# Patient Record
Sex: Female | Born: 1940 | Race: Black or African American | Hispanic: No | Marital: Married | Smoking: Former smoker
Health system: Southern US, Community
[De-identification: ages and names within clinical notes are randomized; demographics above are authoritative.]

## PROBLEM LIST (undated history)

## (undated) ENCOUNTER — Ambulatory Visit (HOSPITAL_BASED_OUTPATIENT_CLINIC_OR_DEPARTMENT_OTHER): Admission: EM

## (undated) DIAGNOSIS — E785 Hyperlipidemia, unspecified: Secondary | ICD-10-CM

## (undated) DIAGNOSIS — N189 Chronic kidney disease, unspecified: Secondary | ICD-10-CM

## (undated) DIAGNOSIS — E119 Type 2 diabetes mellitus without complications: Secondary | ICD-10-CM

## (undated) DIAGNOSIS — D649 Anemia, unspecified: Secondary | ICD-10-CM

## (undated) DIAGNOSIS — E079 Disorder of thyroid, unspecified: Secondary | ICD-10-CM

## (undated) DIAGNOSIS — I10 Essential (primary) hypertension: Secondary | ICD-10-CM

## (undated) HISTORY — DX: Chronic kidney disease, unspecified: N18.9

## (undated) HISTORY — PX: FOOT SURGERY: SHX648

## (undated) HISTORY — DX: Hyperlipidemia, unspecified: E78.5

## (undated) HISTORY — DX: Anemia, unspecified: D64.9

## (undated) HISTORY — PX: ABDOMINAL HYSTERECTOMY: SHX81

## (undated) HISTORY — DX: Type 2 diabetes mellitus without complications: E11.9

## (undated) HISTORY — DX: Essential (primary) hypertension: I10

## (undated) HISTORY — DX: Disorder of thyroid, unspecified: E07.9

---

## 2000-11-04 HISTORY — PX: TONSILLECTOMY: SUR1361

## 2002-12-17 ENCOUNTER — Other Ambulatory Visit: Admission: RE | Admit: 2002-12-17 | Discharge: 2002-12-17 | Payer: Self-pay | Admitting: Obstetrics and Gynecology

## 2003-03-17 ENCOUNTER — Ambulatory Visit (HOSPITAL_COMMUNITY): Admission: RE | Admit: 2003-03-17 | Discharge: 2003-03-17 | Payer: Self-pay | Admitting: *Deleted

## 2003-03-17 ENCOUNTER — Encounter: Payer: Self-pay | Admitting: *Deleted

## 2008-10-11 ENCOUNTER — Inpatient Hospital Stay (HOSPITAL_COMMUNITY): Admission: RE | Admit: 2008-10-11 | Discharge: 2008-10-12 | Payer: Self-pay | Admitting: Neurosurgery

## 2009-05-10 IMAGING — CR DG CHEST 2V
2 series · 2 of 2 positions shown · non-contrast
Comparison: None available

CLINICAL DATA: ACDF 10/11/2008.  Hypertension, smoker.

CHEST - 2 VIEW

[view not recorded (1 of 2)]
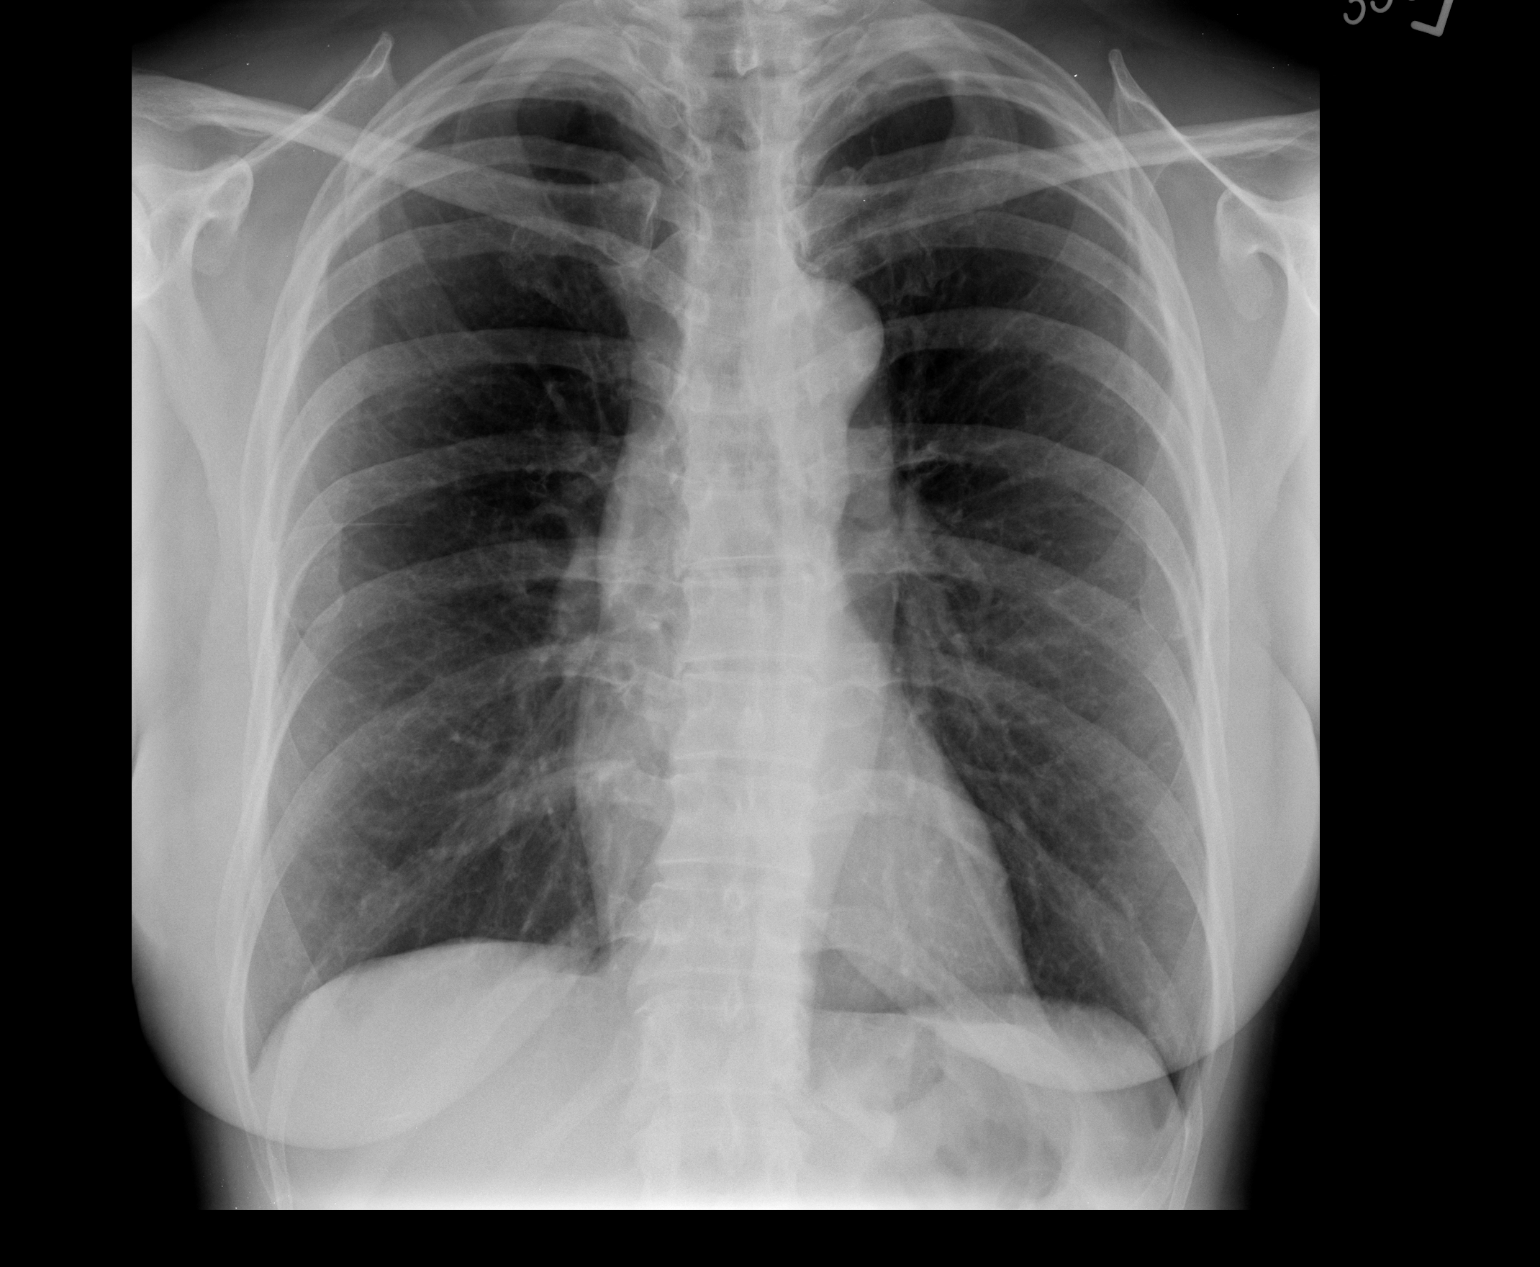

[view not recorded (2 of 2)]
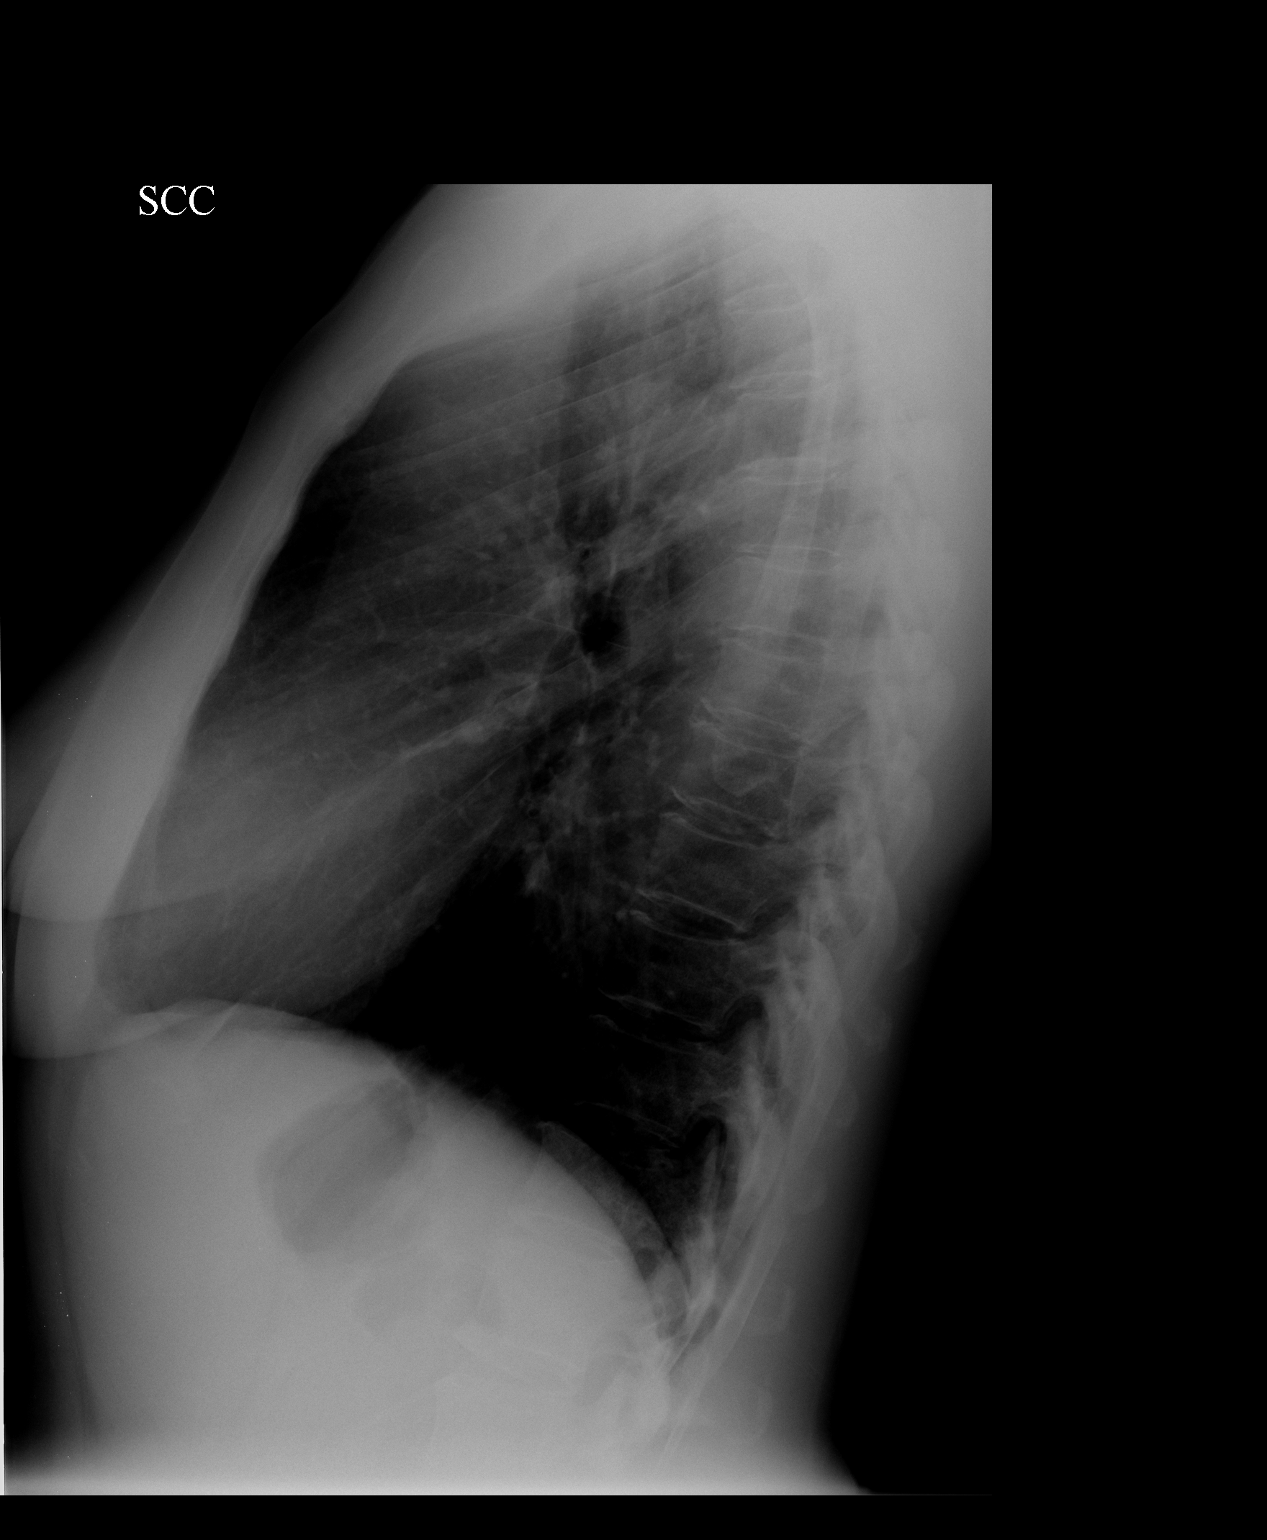

[2 of 2 positions shown; findings below may reference images not displayed]

FINDINGS: Trachea is midline.  Heart size normal.  Lungs are clear.
Biapical pleural thickening.  No pleural fluid.
IMPRESSION: No acute findings.

## 2009-05-16 IMAGING — CR DG CERVICAL SPINE 2 OR 3 VIEWS
1 series · 1 of 1 positions shown · non-contrast
Comparison: None

CLINICAL DATA: C4-5 ACDF

CERVICAL SPINE - 2-3 VIEW

[view not recorded]
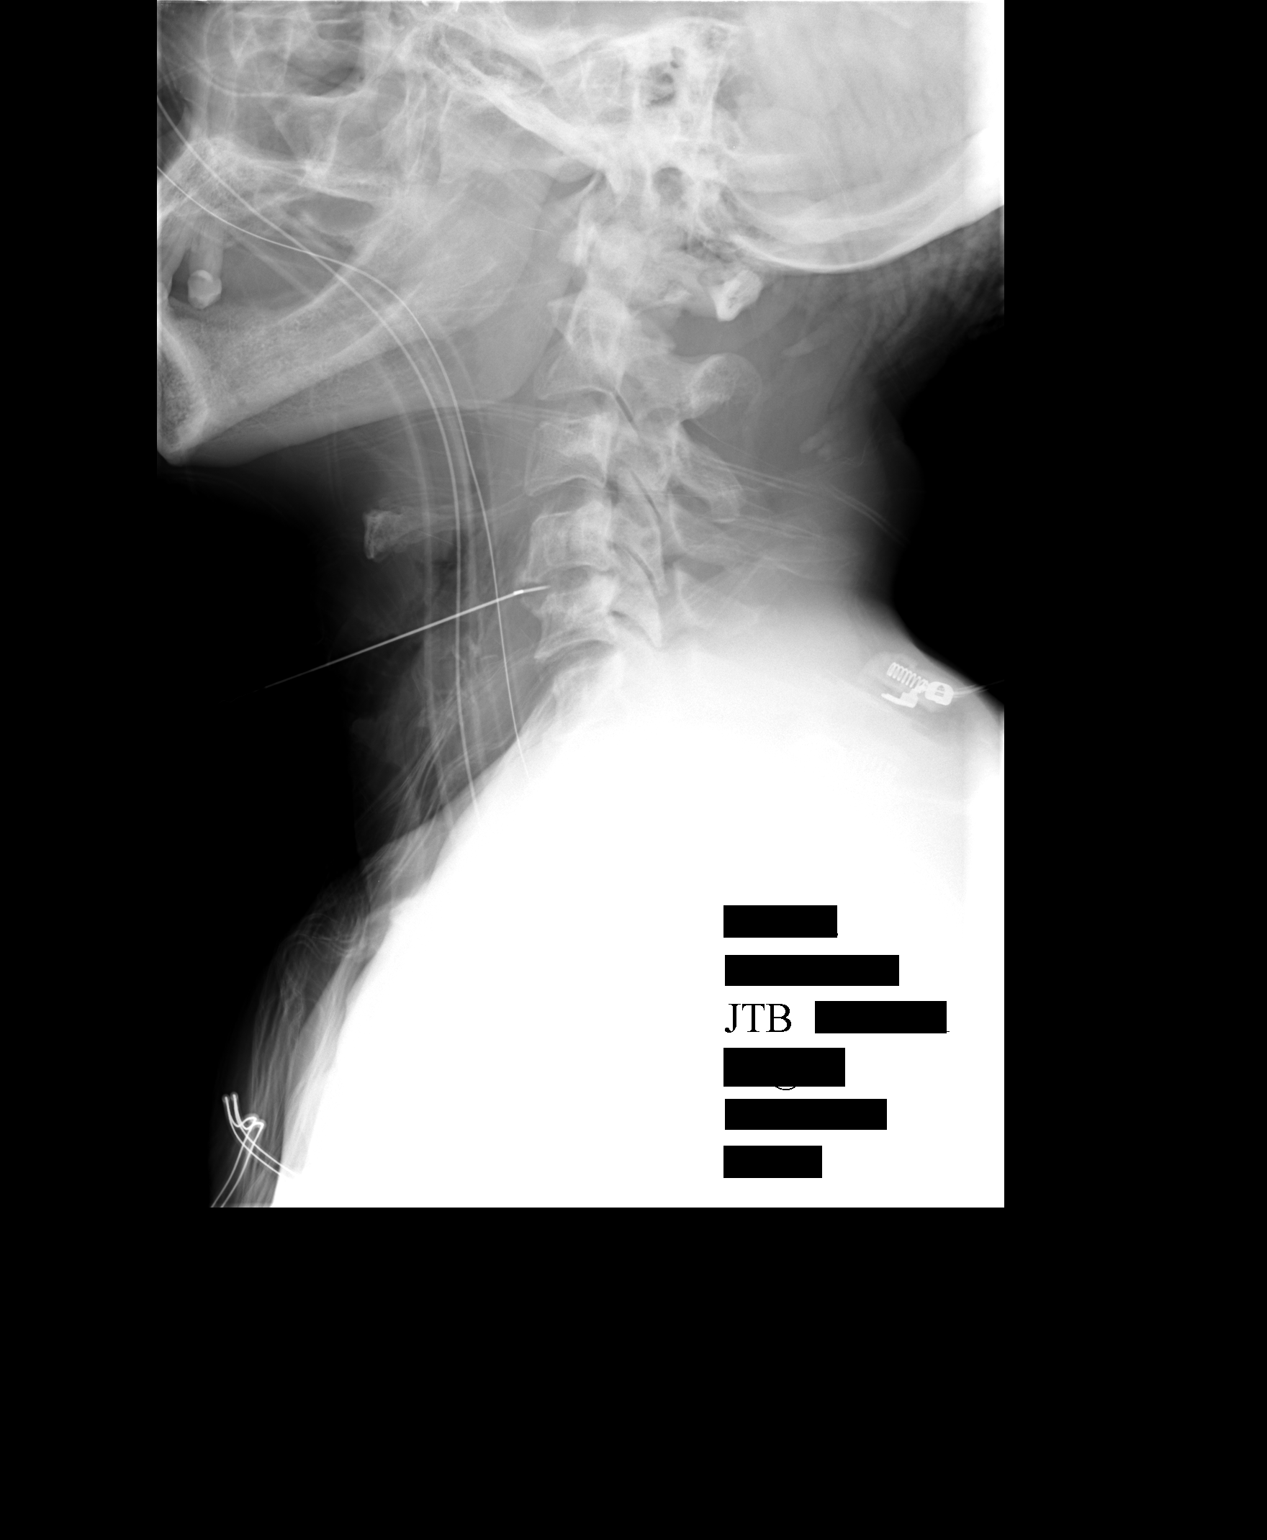

[1 of 1 positions shown; findings below may reference images not displayed]

FINDINGS: An initial film shows a needle anteriorly at C4-5.  A
second film shows anterior cervical discectomy and fusion at C4-5.
Interbody fusion material is in place.  There is an anterior plate
with screw fixation.  Sponges are in place along operative
approach.
IMPRESSION: ACDF C4-5

## 2011-03-19 NOTE — Op Note (Signed)
NAMEDONATA, [place] NO.:  [id number]   MEDICAL RECORD NO.:  [id number]          PATIENT TYPE:  INP   LOCATION:  2899                         FACILITY:  [facility]   PHYSICIAN:  Danae Orleans. Venetia Maxon, M.D.  DATE OF BIRTH:  1941-07-03   DATE OF PROCEDURE:  10/11/2008  DATE OF DISCHARGE:                               OPERATIVE REPORT   PREOPERATIVE DIAGNOSES:  Herniated cervical disk with spondylosis,  degenerative disk disease, and radiculopathy, C4-C5.   POSTOPERATIVE DIAGNOSES:  Herniated cervical disk with spondylosis,  degenerative disk disease, and radiculopathy, C4-C5.   PROCEDURE:  Anterior cervical decompression and fusion, C4-C5, with  allograft bone wedge, morcellized bone autograft, and anterior cervical  plate.   SURGEON:  Danae Orleans. Venetia Maxon, MD   ASSISTANTS:  1. Georgiann Cocker, RN  2. Hilda Lias, MD   ANESTHESIA:  General endotracheal anesthesia.   ESTIMATED BLOOD LOSS:  Minimal.   COMPLICATIONS:  None.   DISPOSITION:  Recovery.   INDICATIONS:  Katherine Richmond is a 70 year old woman with significant right  C5 radiculopathy with right deltoid weakness and pain.  She has  significant disk herniation at C4-C5 on the right.  It was elected to  take her to Surgery for anterior cervical decompression and fusion at  this affected level.   PROCEDURE IN DETAIL:  Katherine [place] was brought to the operating room.  Following a satisfactory and uncomplicated induction of general  endotracheal anesthesia and placement of intravenous lines, the patient  was placed in supine position on the operating table, and her neck was  placed in slight extension.  She was placed in 5 pounds halter traction.  Anterior neck was then prepped and draped in the usual sterile fashion.  The area of planned incision was infiltrated with local lidocaine.  Incision was made from the midline to the anterior border of  sternocleidomastoid muscle on the left side of midline overlying the C4-  C5 level and carried through platysma layer.  Blunt dissection was  performed along the anterior border of sternocleidomastoid muscle  keeping the carotid sheath lateral, trachea and esophagus kept medial,  exposing the cervical spine.  A bent spinal needle was placed at what  was felt to be the C4-C5 level, and this was confirmed on intraoperative  x-ray.  Subsequently, the longus colli muscles were taken down from the  anterior cervical spine using electrocautery and Key elevator, and a  self-retaining Shadow-Line retractor was placed to facilitate exposure.  A largely spondylitic interspace with highly degenerated disk material  was then incised and disk material was removed.  Endplates were stripped  of residual disk material.  Both lateral aspects of the interspace were  also cleared of disk material.  Ventral osteophytes were removed with  biting tools, and this bone was cleared of investing soft tissue and  later saved for use in bone grafting.  Subsequently under microscopic  visualization, the endplates were decorticated with high-speed drill,  and this bone was also saved for later bone grafting.  The uncinate  spurs were then drilled down.  Under microscopic visualization, the  posterior longitudinal ligament was incised initially with a nerve hook  and subsequently with 2-mm gold-tipped Kerrison rongeurs, and a large  amount of hypertrophied bone was removed.  The right-sided C5 nerve root  was widely decompressed, and herniated disk material was removed at this  level.  The left side was not nearly as compressed.  Hemostasis was  assured with Gelfoam soaked in thrombin.  After trial sizing, a 7-mm  allograft bone wedge was selected, fashioned with a high-speed drill,  packed with morcellized bone autograft, inserted in the interspace, and  countersunk appropriately.  A 14-mm Trestle anterior cervical plate was  then affixed to the anterior cervical spine using variable angled  14-mm  screws, two at C4 and two at C5.  All screws had excellent purchase.  The traction weight was removed prior to placing the plate.  The final x-  ray demonstrated well-positioned interbody grafts and anterior cervical  plate.  Wound was irrigated.  Soft tissues were inspected and found to  be in good repair.  Hemostasis was assured.  Platysma layer was closed  with 3-0 Vicryl sutures.  Skin edges were reapproximated with 3-0 Vicryl  subcuticular stitch.  The wound was dressed with Dermabond.  The patient  was extubated in the operating room and taken to recovery in stable and  satisfactory condition, having tolerated the operation well.  Counts  were correct at the end of the case.      Danae Orleans. Venetia Maxon, M.D.  Electronically Signed     JDS/MEDQ  D:  10/11/2008  T:  10/11/2008  Job:  [id number]

## 2011-08-09 LAB — CBC
HCT: 38.4 % (ref 36.0–46.0)
Hemoglobin: 13 g/dL (ref 12.0–15.0)
RBC: 4.49 MIL/uL (ref 3.87–5.11)
WBC: 6.2 10*3/uL (ref 4.0–10.5)

## 2011-08-09 LAB — BASIC METABOLIC PANEL
GFR calc Af Amer: >60 mL/min (ref >60)
GFR calc non Af Amer: >60 mL/min (ref >60)
Potassium: 4.4 mEq/L (ref 3.5–5.1)
Sodium: 139 mEq/L (ref 135–145)

## 2016-03-19 DIAGNOSIS — N289 Disorder of kidney and ureter, unspecified: Secondary | ICD-10-CM | POA: Diagnosis not present

## 2016-03-19 DIAGNOSIS — D509 Iron deficiency anemia, unspecified: Secondary | ICD-10-CM | POA: Diagnosis not present

## 2016-09-10 DIAGNOSIS — D631 Anemia in chronic kidney disease: Secondary | ICD-10-CM | POA: Diagnosis not present

## 2016-09-10 DIAGNOSIS — N189 Chronic kidney disease, unspecified: Secondary | ICD-10-CM

## 2016-09-10 DIAGNOSIS — D509 Iron deficiency anemia, unspecified: Secondary | ICD-10-CM | POA: Diagnosis not present

## 2016-11-04 HISTORY — PX: EYE SURGERY: SHX253

## 2016-12-05 HISTORY — PX: EYE SURGERY: SHX253

## 2017-03-18 DIAGNOSIS — Z862 Personal history of diseases of the blood and blood-forming organs and certain disorders involving the immune mechanism: Secondary | ICD-10-CM | POA: Diagnosis not present

## 2017-09-23 DIAGNOSIS — Z862 Personal history of diseases of the blood and blood-forming organs and certain disorders involving the immune mechanism: Secondary | ICD-10-CM | POA: Diagnosis not present

## 2018-03-26 DIAGNOSIS — C480 Malignant neoplasm of retroperitoneum: Secondary | ICD-10-CM | POA: Insufficient documentation

## 2018-05-13 HISTORY — PX: NEPHRECTOMY: SHX65

## 2018-07-05 DIAGNOSIS — C801 Malignant (primary) neoplasm, unspecified: Secondary | ICD-10-CM

## 2018-07-05 HISTORY — DX: Malignant (primary) neoplasm, unspecified: C80.1

## 2018-09-22 DIAGNOSIS — Z862 Personal history of diseases of the blood and blood-forming organs and certain disorders involving the immune mechanism: Secondary | ICD-10-CM

## 2018-09-22 DIAGNOSIS — N289 Disorder of kidney and ureter, unspecified: Secondary | ICD-10-CM

## 2018-09-22 DIAGNOSIS — Z905 Acquired absence of kidney: Secondary | ICD-10-CM

## 2019-03-23 DIAGNOSIS — Z862 Personal history of diseases of the blood and blood-forming organs and certain disorders involving the immune mechanism: Secondary | ICD-10-CM | POA: Diagnosis not present

## 2019-09-27 DIAGNOSIS — Z862 Personal history of diseases of the blood and blood-forming organs and certain disorders involving the immune mechanism: Secondary | ICD-10-CM

## 2019-11-05 DIAGNOSIS — C799 Secondary malignant neoplasm of unspecified site: Secondary | ICD-10-CM

## 2019-11-05 HISTORY — DX: Secondary malignant neoplasm of unspecified site: C79.9

## 2019-11-09 DIAGNOSIS — C787 Secondary malignant neoplasm of liver and intrahepatic bile duct: Secondary | ICD-10-CM | POA: Diagnosis not present

## 2019-11-09 DIAGNOSIS — C480 Malignant neoplasm of retroperitoneum: Secondary | ICD-10-CM | POA: Diagnosis not present

## 2019-11-09 DIAGNOSIS — K769 Liver disease, unspecified: Secondary | ICD-10-CM | POA: Diagnosis not present

## 2019-11-11 DIAGNOSIS — C480 Malignant neoplasm of retroperitoneum: Secondary | ICD-10-CM | POA: Diagnosis not present

## 2019-11-11 DIAGNOSIS — Z01812 Encounter for preprocedural laboratory examination: Secondary | ICD-10-CM | POA: Diagnosis not present

## 2019-11-11 DIAGNOSIS — Z20822 Contact with and (suspected) exposure to covid-19: Secondary | ICD-10-CM | POA: Diagnosis not present

## 2019-11-11 DIAGNOSIS — R7309 Other abnormal glucose: Secondary | ICD-10-CM | POA: Diagnosis not present

## 2019-11-11 DIAGNOSIS — Z01810 Encounter for preprocedural cardiovascular examination: Secondary | ICD-10-CM | POA: Diagnosis not present

## 2019-11-18 DIAGNOSIS — C480 Malignant neoplasm of retroperitoneum: Secondary | ICD-10-CM | POA: Diagnosis not present

## 2019-11-18 DIAGNOSIS — G8918 Other acute postprocedural pain: Secondary | ICD-10-CM | POA: Diagnosis not present

## 2019-11-18 DIAGNOSIS — C787 Secondary malignant neoplasm of liver and intrahepatic bile duct: Secondary | ICD-10-CM | POA: Diagnosis not present

## 2019-11-18 HISTORY — PX: LAPAROSCOPIC PARTIAL HEPATECTOMY: SHX5909

## 2019-11-29 DIAGNOSIS — H3581 Retinal edema: Secondary | ICD-10-CM | POA: Diagnosis not present

## 2019-11-29 DIAGNOSIS — E119 Type 2 diabetes mellitus without complications: Secondary | ICD-10-CM | POA: Diagnosis not present

## 2019-12-06 DIAGNOSIS — H524 Presbyopia: Secondary | ICD-10-CM | POA: Diagnosis not present

## 2019-12-14 DIAGNOSIS — Z48815 Encounter for surgical aftercare following surgery on the digestive system: Secondary | ICD-10-CM | POA: Diagnosis not present

## 2019-12-14 DIAGNOSIS — Z9089 Acquired absence of other organs: Secondary | ICD-10-CM | POA: Diagnosis not present

## 2019-12-14 DIAGNOSIS — C480 Malignant neoplasm of retroperitoneum: Secondary | ICD-10-CM | POA: Diagnosis not present

## 2019-12-21 ENCOUNTER — Ambulatory Visit (INDEPENDENT_AMBULATORY_CARE_PROVIDER_SITE_OTHER): Payer: Medicare PPO

## 2019-12-21 ENCOUNTER — Other Ambulatory Visit: Payer: Self-pay

## 2019-12-21 VITALS — BP 128/72 | HR 63 | Temp 97.8°F | Resp 16 | Ht 66.0 in | Wt 183.4 lb

## 2019-12-21 DIAGNOSIS — Z0000 Encounter for general adult medical examination without abnormal findings: Secondary | ICD-10-CM | POA: Diagnosis not present

## 2019-12-21 NOTE — Patient Instructions (Signed)
Fall Prevention in the Home, Adult Falls can cause injuries. They can happen to people of all ages. There are many things you can do to make your home safe and to help prevent falls. Ask for help when making these changes, if needed. What actions can I take to prevent falls? General Instructions  Use good lighting in all rooms. Replace any light bulbs that burn out.  Turn on the lights when you go into a dark area. Use night-lights.  Keep items that you use often in easy-to-reach places. Lower the shelves around your home if necessary.  Set up your furniture so you have a clear path. Avoid moving your furniture around.  Do not have throw rugs and other things on the floor that can make you trip.  Avoid walking on wet floors.  If any of your floors are uneven, fix them.  Add color or contrast paint or tape to clearly mark and help you see: ? Any grab bars or handrails. ? First and last steps of stairways. ? Where the edge of each step is.  If you use a stepladder: ? Make sure that it is fully opened. Do not climb a closed stepladder. ? Make sure that both sides of the stepladder are locked into place. ? Ask someone to hold the stepladder for you while you use it.  If there are any pets around you, be aware of where they are. What can I do in the bathroom?      Keep the floor dry. Clean up any water that spills onto the floor as soon as it happens.  Remove soap buildup in the tub or shower regularly.  Use non-skid mats or decals on the floor of the tub or shower.  Attach bath mats securely with double-sided, non-slip rug tape.  If you need to sit down in the shower, use a plastic, non-slip stool.  Install grab bars by the toilet and in the tub and shower. Do not use towel bars as grab bars. What can I do in the bedroom?  Make sure that you have a light by your bed that is easy to reach.  Do not use any sheets or blankets that are too big for your bed. They should  not hang down onto the floor.  Have a firm chair that has side arms. You can use this for support while you get dressed. What can I do in the kitchen?  Clean up any spills right away.  If you need to reach something above you, use a strong step stool that has a grab bar.  Keep electrical cords out of the way.  Do not use floor polish or wax that makes floors slippery. If you must use wax, use non-skid floor wax. What can I do with my stairs?  Do not leave any items on the stairs.  Make sure that you have a light switch at the top of the stairs and the bottom of the stairs. If you do not have them, ask someone to add them for you.  Make sure that there are handrails on both sides of the stairs, and use them. Fix handrails that are broken or loose. Make sure that handrails are as long as the stairways.  Install non-slip stair treads on all stairs in your home.  Avoid having throw rugs at the top or bottom of the stairs. If you do have throw rugs, attach them to the floor with carpet tape.  Choose a carpet that  does not hide the edge of the steps on the stairway.  Check any carpeting to make sure that it is firmly attached to the stairs. Fix any carpet that is loose or worn. What can I do on the outside of my home?  Use bright outdoor lighting.  Regularly fix the edges of walkways and driveways and fix any cracks.  Remove anything that might make you trip as you walk through a door, such as a raised step or threshold.  Trim any bushes or trees on the path to your home.  Regularly check to see if handrails are loose or broken. Make sure that both sides of any steps have handrails.  Install guardrails along the edges of any raised decks and porches.  Clear walking paths of anything that might make someone trip, such as tools or rocks.  Have any leaves, snow, or ice cleared regularly.  Use sand or salt on walking paths during winter.  Clean up any spills in your garage right  away. This includes grease or oil spills. What other actions can I take?  Wear shoes that: ? Have a low heel. Do not wear high heels. ? Have rubber bottoms. ? Are comfortable and fit you well. ? Are closed at the toe. Do not wear open-toe sandals.  Use tools that help you move around (mobility aids) if they are needed. These include: ? Canes. ? Walkers. ? Scooters. ? Crutches.  Review your medicines with your doctor. Some medicines can make you feel dizzy. This can increase your chance of falling. Ask your doctor what other things you can do to help prevent falls. Where to find more information  Centers for Disease Control and Prevention, [employer]AD[employer]: https://garcia.biz/  [employer] on Aging: BrainJudge.co.uk Contact a doctor if:  You are afraid of falling at home.  You feel weak, drowsy, or dizzy at home.  You fall at home. Summary  There are many simple things that you can do to make your home safe and to help prevent falls.  Ways to make your home safe include removing tripping hazards and installing grab bars in the bathroom.  Ask for help when making these changes in your home. This information is not intended to replace advice given to you by your health care provider. Make sure you discuss any questions you have with your health care provider. Document Revised: 02/11/2019 Document Reviewed: 06/05/2017 Elsevier Patient Education  2020 [place] Maintenance, Female Adopting a healthy lifestyle and getting preventive care are important in promoting health and wellness. Ask your health care provider about:  The right schedule for you to have regular tests and exams.  Things you can do on your own to prevent diseases and keep yourself healthy. What should I know about diet, weight, and exercise? Eat a healthy diet   Eat a diet that includes plenty of vegetables, fruits, low-fat dairy products, and lean protein.  Do not eat a lot of foods  that are high in solid fats, added sugars, or sodium. Maintain a healthy weight Body mass index (BMI) is used to identify weight problems. It estimates body fat based on height and weight. Your health care provider can help determine your BMI and help you achieve or maintain a healthy weight. Get regular exercise Get regular exercise. This is one of the most important things you can do for your health. Most adults should:  Exercise for at least 150 minutes each week. The exercise should increase your heart rate  and make you sweat (moderate-intensity exercise).  Do strengthening exercises at least twice a week. This is in addition to the moderate-intensity exercise.  Spend less time sitting. Even light physical activity can be beneficial. Watch cholesterol and blood lipids Have your blood tested for lipids and cholesterol at 77 years of age, then have this test every 5 years. Have your cholesterol levels checked more often if:  Your lipid or cholesterol levels are high.  You are older than 77 years of age.  You are at high risk for heart disease. What should I know about cancer screening? Depending on your health history and family history, you may need to have cancer screening at various ages. This may include screening for:  Breast cancer.  Cervical cancer.  Colorectal cancer.  Skin cancer.  Lung cancer. What should I know about heart disease, diabetes, and high blood pressure? Blood pressure and heart disease  High blood pressure causes heart disease and increases the risk of stroke. This is more likely to develop in people who have high blood pressure readings, are of African descent, or are overweight.  Have your blood pressure checked: ? Every 3-5 years if you are 11-76 years of age. ? Every year if you are 38 years old or older. Diabetes Have regular diabetes screenings. This checks your fasting blood sugar level. Have the screening done:  Once every three years after  age 89 if you are at a normal weight and have a low risk for diabetes.  More often and at a younger age if you are overweight or have a high risk for diabetes. What should I know about preventing infection? Hepatitis B If you have a higher risk for hepatitis B, you should be screened for this virus. Talk with your health care provider to find out if you are at risk for hepatitis B infection. Hepatitis C Testing is recommended for:  Everyone born from [birth year] through 1963.  Anyone with known risk factors for hepatitis C. Sexually transmitted infections (STIs)  Get screened for STIs, including gonorrhea and chlamydia, if: ? You are sexually active and are younger than 77 years of age. ? You are older than 77 years of age and your health care provider tells you that you are at risk for this type of infection. ? Your sexual activity has changed since you were last screened, and you are at increased risk for chlamydia or gonorrhea. Ask your health care provider if you are at risk.  Ask your health care provider about whether you are at high risk for HIV. Your health care provider may recommend a prescription medicine to help prevent HIV infection. If you choose to take medicine to prevent HIV, you should first get tested for HIV. You should then be tested every 3 months for as long as you are taking the medicine. Pregnancy  If you are about to stop having your period (premenopausal) and you may become pregnant, seek counseling before you get pregnant.  Take 400 to 800 micrograms (mcg) of folic acid every day if you become pregnant.  Ask for birth control (contraception) if you want to prevent pregnancy. Osteoporosis and menopause Osteoporosis is a disease in which the bones lose minerals and strength with aging. This can result in bone fractures. If you are 57 years old or older, or if you are at risk for osteoporosis and fractures, ask your health care provider if you should:  Be screened for  bone loss.  Take a calcium or vitamin  D supplement to lower your risk of fractures.  Be given hormone replacement therapy (HRT) to treat symptoms of menopause. Follow these instructions at home: Lifestyle  Do not use any products that contain nicotine or tobacco, such as cigarettes, e-cigarettes, and chewing tobacco. If you need help quitting, ask your health care provider.  Do not use street drugs.  Do not share needles.  Ask your health care provider for help if you need support or information about quitting drugs. Alcohol use  Do not drink alcohol if: ? Your health care provider tells you not to drink. ? You are pregnant, may be pregnant, or are planning to become pregnant.  If you drink alcohol: ? Limit how much you use to 0-1 drink a day. ? Limit intake if you are breastfeeding.  Be aware of how much alcohol is in your drink. In the [place] one drink equals one 12 oz bottle of beer (355 mL), one 5 oz glass of wine (148 mL), or one 1 oz glass of hard liquor (44 mL). General instructions  Schedule regular health, dental, and eye exams.  Stay current with your vaccines.  Tell your health care provider if: ? You often feel depressed. ? You have ever been abused or do not feel safe at home. Summary  Adopting a healthy lifestyle and getting preventive care are important in promoting health and wellness.  Follow your health care provider's instructions about healthy diet, exercising, and getting tested or screened for diseases.  Follow your health care provider's instructions on monitoring your cholesterol and blood pressure. This information is not intended to replace advice given to you by your health care provider. Make sure you discuss any questions you have with your health care provider. Document Revised: 10/14/2018 Document Reviewed: 10/14/2018 Elsevier Patient Education  [place] A mammogram is a low energy X-ray of the breasts that is done  to check for abnormal changes. This procedure can screen for and detect any changes that may indicate breast cancer. Mammograms are regularly done on women. A man may have a mammogram if he has a lump or swelling in his breast. A mammogram can also identify other changes and variations in the breast, such as:  Inflammation of the breast tissue (mastitis).  An infected area that contains a collection of pus (abscess).  A fluid-filled sac (cyst).  Fibrocystic changes. This is when breast tissue becomes denser, which can make the tissue feel rope-like or uneven under the skin.  Tumors that are not cancerous (benign). Tell a health care provider:  About any allergies you have.  If you have breast implants.  If you have had previous breast disease, biopsy, or surgery.  If you are breastfeeding.  If you are younger than age 66.  If you have a family history of breast cancer.  Whether you are pregnant or may be pregnant. What are the risks? Generally, this is a safe procedure. However, problems may occur, including:  Exposure to radiation. Radiation levels are very low with this test.  The results being misinterpreted.  The need for further tests.  The inability of the mammogram to detect certain cancers. What happens before the procedure?  Schedule your test about 1-2 weeks after your menstrual period if you are still menstruating. This is usually when your breasts are the least tender.  If you have had a mammogram done at a different facility in the past, get the mammogram X-rays or have them sent to your current  exam facility. The new and old images will be compared.  Wash your breasts and underarms on the day of the test.  Do not wear deodorants, perfumes, lotions, or powders anywhere on your body on the day of the test.  Remove any jewelry from your neck.  Wear clothes that you can change into and out of easily. What happens during the procedure?   You will undress  from the waist up and put on a gown that opens in the front.  You will stand in front of the X-ray machine.  Each breast will be placed between two plastic or glass plates. The plates will compress your breast for a few seconds. Try to stay as relaxed as possible during the procedure. This does not cause any harm to your breasts and any discomfort you feel will be very brief.  X-rays will be taken from different angles of each breast. The procedure may vary among health care providers and hospitals. What happens after the procedure?  The mammogram will be examined by a specialist (radiologist).  You may need to repeat certain parts of the test, depending on the quality of the images. This is commonly done if the radiologist needs a better view of the breast tissue.  You may resume your normal activities.  It is up to you to get the results of your procedure. Ask your health care provider, or the department that is doing the procedure, when your results will be ready. Summary  A mammogram is a low energy X-ray of the breasts that is done to check for abnormal changes. A man may have a mammogram if he has a lump or swelling in his breast.  If you have had a mammogram done at a different facility in the past, get the mammogram X-rays or have them sent to your current exam facility in order to compare them.  Schedule your test about 1-2 weeks after your menstrual period if you are still menstruating.  For this test, each breast will be placed between two plastic or glass plates. The plates will compress your breast for a few seconds.  Ask when your test results will be ready. Make sure you get your test results. This information is not intended to replace advice given to you by your health care provider. Make sure you discuss any questions you have with your health care provider. Document Revised: 06/11/2018 Document Reviewed: 06/11/2018 Elsevier Patient Education  2020 [place] you for enrolling in [place]. Please follow the instructions below to securely access your online medical record. MyChart allows you to send messages to your doctor, view your test results, manage appointments, and more.   How Do I Sign Up? 1. In your Internet browser, go to [employer] and enter [email or web address]. 2. Click on the Sign Up Now link in the Sign In box. You will see the New Member Sign Up page. 3. Enter your MyChart Access Code exactly as it appears below. You will not need to use this code after youve completed the sign-up process. If you do not sign up before the expiration date, you must request a new code.  MyChart Access Code: [email or web address] Expires: 01/13/2020  3:10 AM  4. Enter your Social Security Number ([id number]) and Date of Birth (mm/dd/yyyy) as indicated and click Submit. You will be taken to the next sign-up page. 5. Create a MyChart ID. This will be your MyChart login ID and cannot be changed, so think of one that is  secure and easy to remember. 6. Create a MyChart password. You can change your password at any time. 7. Enter your Password Reset Question and Answer. This can be used at a later time if you forget your password.  8. Enter your e-mail address. You will receive e-mail notification when new information is available in [place]. 9. Click Sign Up. You can now view your medical record.   Additional Information Remember, MyChart is NOT to be used for urgent needs. For medical emergencies, dial 911.

## 2019-12-21 NOTE — Progress Notes (Signed)
Subjective:   Katherine Richmond is a 79 y.o. female who presents for Medicare Annual (Subsequent) preventive examination. This wellness visit is conducted by a nurse.  The patient's medications were reviewed and reconciled since the patient's last visit.  History details were provided by the patient.  The history appears to be reliable.    Patient's last AWV was 1 year ago.   Medical History: Patient history and Family history was reviewed  Medications, Allergies, and preventative health maintenance was reviewed and updated.  Review of Systems:  Review of Systems  Constitutional: Negative.   HENT: Negative.   Respiratory: Negative.  Negative for cough and wheezing.   Cardiovascular: Negative.  Negative for chest pain and palpitations.  Neurological: Negative.   Psychiatric/Behavioral: Negative.  Negative for confusion and suicidal ideas.   Cardiac Risk Factors include: advanced age (>62men, >63 women);diabetes mellitus     Objective:     Vitals: BP 128/72 (BP Location: Left Arm, Patient Position: Sitting, Cuff Size: Large)   Pulse 63   Temp 97.8 F (36.6 C) (Temporal)   Resp 16   Ht 5\' 6"  (1.676 m)   Wt 183 lb 6.4 oz (83.2 kg)   SpO2 97%   BMI 29.60 kg/m   Body mass index is 29.6 kg/m.  No flowsheet data found.  Tobacco Social History   Tobacco Use  Smoking Status Former Smoker  . Types: Cigarettes  . Quit date: 2012  . Years since quitting: 9.1  Smokeless Tobacco Never Used     Counseling given: Not Answered   Clinical Intake:     Pain Score: 0-No pain                 Past Medical History:  Diagnosis Date  . Anemia   . Chronic kidney disease   . Diabetes mellitus without complication ([place])   . Hyperlipidemia   . Hypertension   . Thyroid disease    Past Surgical History:  Procedure Laterality Date  . ABDOMINAL HYSTERECTOMY    . EYE SURGERY Right 11/2016   CATARACT REMOVAL  . EYE SURGERY Left 12/2016   CATARACT REMOVAL  .  NEPHRECTOMY Right 05/13/2018  . TONSILLECTOMY  2002   Family History  Problem Relation Age of Onset  . Breast cancer Mother   . Colon cancer Mother   . Renal cancer Mother   . Liver cancer Half-Brother   . Multiple sclerosis Half-Brother   . Hypertension Other   . Transient ischemic attack Other   . Glaucoma Other    Social History   Socioeconomic History  . Marital status: Married    Spouse name: Engineer, drilling  . Number of children: 1  . Years of education: Not on file  . Highest education level: Not on file  Occupational History  . Occupation: RETIRED  . Occupation: [employer], [place], [employer]  Tobacco Use  . Smoking status: Former Smoker    Types: Cigarettes    Quit date: 2012    Years since quitting: 9.1  . Smokeless tobacco: Never Used  Substance and Sexual Activity  . Alcohol use: Yes    Alcohol/week: 1.0 standard drinks    Types: 1 Glasses of wine per week    Comment: SOCIAL USE ONLY  . Drug use: Never  . Sexual activity: Not on file  Other Topics Concern  . Not on file  Social History Narrative  . Not on file   Social Determinants of Health   Financial Resource  Strain:   . Difficulty of Paying Living Expenses: Not on file  Food Insecurity:   . Worried About Charity fundraiser in the Last Year: Not on file  . Ran Out of Food in the Last Year: Not on file  Transportation Needs:   . Lack of Transportation (Medical): Not on file  . Lack of Transportation (Non-Medical): Not on file  Physical Activity:   . Days of Exercise per Week: Not on file  . Minutes of Exercise per Session: Not on file  Stress:   . Feeling of Stress : Not on file  Social Connections:   . Frequency of Communication with Friends and Family: Not on file  . Frequency of Social Gatherings with Friends and Family: Not on file  . Attends Religious Services: Not on file  . Active Member of Clubs or Organizations: Not on file  . Attends Archivist Meetings: Not on file  . Marital  Status: Not on file    Outpatient Encounter Medications as of 12/21/2019  Medication Sig  . aspirin 81 MG EC tablet Take by mouth.  Marland Kitchen atorvastatin (LIPITOR) 40 MG tablet TAKE 1 TABLET BY MOUTH DAILY  . estradiol (ESTRACE) 1 MG tablet TAKE 1 TABLET BY MOUTH ONCE DAILY. NEEDS TO CALL FOR APPT SOON  . ferrous sulfate 325 (65 FE) MG tablet Take by mouth.  . fluticasone (FLONASE) 50 MCG/ACT nasal spray INSERT 2 SPRAY(S) IN EACH NOSTRIL DAILY AS NEEDED FOR ALLERGIES  . gabapentin (NEURONTIN) 300 MG capsule Take by mouth.  . irbesartan-hydrochlorothiazide (AVALIDE) 150-12.5 MG tablet Take by mouth.  . levothyroxine (SYNTHROID) 50 MCG tablet Take by mouth.  . metFORMIN (GLUCOPHAGE) 1000 MG tablet TAKE 1 TABLET BY MOUTH DAILY  . metoprolol succinate (TOPROL-XL) 100 MG 24 hr tablet Take by mouth.  . Multiple Vitamins-Minerals (MULTIPLE VITAMINS/WOMENS PO) Take by mouth.  . Omega-3 Fatty Acids (OMEGA-3 FISH OIL) 1200 MG CAPS Take by mouth.  . traZODone (DESYREL) 50 MG tablet Take by mouth.   No facility-administered encounter medications on file as of 12/21/2019.    Activities of Daily Living In your present state of health, do you have any difficulty performing the following activities: 12/21/2019  Hearing? N  Vision? N  Difficulty concentrating or making decisions? N  Walking or climbing stairs? N  Dressing or bathing? N  Doing errands, shopping? N  Preparing Food and eating ? N  Using the Toilet? N  In the past six months, have you accidently leaked urine? N  Do you have problems with loss of bowel control? N  Managing your Medications? N  Managing your Finances? N  Housekeeping or managing your Housekeeping? N  Some recent data might be hidden    Patient Care Team: Rochel Brome, MD as PCP - General (Family Medicine)    Assessment:   This is a routine wellness examination for Katherine Richmond.  Exercise Activities and Dietary recommendations Current Exercise Habits: Home exercise routine,  Type of exercise: walking, Intensity: Mild, Exercise limited by: None identified  Goals   None     Fall Risk Fall Risk  12/21/2019  Falls in the past year? 0  Number falls in past yr: 0  Injury with Fall? 0   Is the patient's home free of loose throw rugs in walkways, pet beds, electrical cords, etc?   yes      Grab bars in the bathroom? no      Handrails on the stairs?   yes  Adequate lighting?   yes   Depression Screen PHQ 2/9 Scores 12/21/2019  PHQ - 2 Score 0     Cognitive Function     6CIT Screen 12/21/2019  What Year? 0 points  What month? 0 points  What time? 0 points  Count back from 20 0 points  Months in reverse 0 points  Repeat phrase 0 points  Total Score 0    Immunization History  Administered Date(s) Administered  . Influenza-Unspecified 06/16/2019  . Pneumococcal Conjugate-13 08/03/2015  . Pneumococcal Polysaccharide-23 11/08/2013  . Tdap 01/05/2013  . Zoster 09/10/2016     Screening Tests Health Maintenance  Topic Date Due  . MAMMOGRAM  2019  . DEXA SCAN  12/21/2020  . TETANUS/TDAP  01/06/2023  . INFLUENZA VACCINE  06/16/2019  . PNA vac Low Risk Adult  Completed    Cancer Screenings: Lung: Low Dose CT Chest recommended if Age 74-80 years, 30 pack-year currently smoking OR have quit w/in 15years. Patient does not qualify. Breast:  Up to date on Mammogram? No   Up to date of Bone Density/Dexa? Yes Colorectal: Colonoscopy done 2021 (Dr Lyda Jester)     Plan:     Patient will follow-up with Dr Tobie Poet and her Oncologist as scheduled.  She will set up a mammogram when she see's her gynecologist.  Patient will continue a healthy diet and to exercise regularly.  I have personally reviewed and noted the following in the patient's chart:   . Medical and social history . Use of alcohol, tobacco or illicit drugs  . Current medications and supplements . Functional ability and status . Nutritional status . Physical activity . Advanced  directives . List of other physicians . Hospitalizations, surgeries, and ER visits in previous 12 months . Vitals . Screenings to include cognitive, depression, and falls . Referrals and appointments  In addition, I have reviewed and discussed with patient certain preventive protocols, quality metrics, and best practice recommendations. A written personalized care plan for preventive services as well as general preventive health recommendations were provided to patient.     Erie Noe, LPN  [id number]

## 2019-12-23 ENCOUNTER — Other Ambulatory Visit: Payer: Self-pay | Admitting: Family Medicine

## 2019-12-28 ENCOUNTER — Telehealth: Payer: Self-pay

## 2019-12-28 NOTE — Telephone Encounter (Signed)
PHARMACY:[facility]  MEDICATION: 1.) gabapentin 300 mg 1 at bt 2.) metoprolol succinate 100 mg  er 1 daily  requesting a 90 day supply  MSG: THE PT CALLED TODAY REQUESTING A REFILL ON THE MEDICATION(S) LISTED ABOVE.

## 2019-12-29 ENCOUNTER — Other Ambulatory Visit: Payer: Self-pay | Admitting: Family Medicine

## 2019-12-29 MED ORDER — GABAPENTIN 300 MG PO CAPS: 300.0000 mg | ORAL_CAPSULE | Freq: Every day | ORAL | 0 refills | Status: DC

## 2020-01-08 MED ORDER — METOPROLOL SUCCINATE ER 100 MG PO TB24: 100.0000 mg | ORAL_TABLET | Freq: Every day | ORAL | 1 refills | Status: DC

## 2020-01-12 ENCOUNTER — Other Ambulatory Visit: Payer: Self-pay

## 2020-01-12 MED ORDER — METOPROLOL SUCCINATE ER 100 MG PO TB24: 100.0000 mg | ORAL_TABLET | Freq: Every day | ORAL | 1 refills | Status: DC

## 2020-01-15 ENCOUNTER — Other Ambulatory Visit: Payer: Self-pay | Admitting: Family Medicine

## 2020-03-15 DIAGNOSIS — E079 Disorder of thyroid, unspecified: Secondary | ICD-10-CM | POA: Insufficient documentation

## 2020-03-15 DIAGNOSIS — E785 Hyperlipidemia, unspecified: Secondary | ICD-10-CM | POA: Insufficient documentation

## 2020-03-15 DIAGNOSIS — E1159 Type 2 diabetes mellitus with other circulatory complications: Secondary | ICD-10-CM | POA: Insufficient documentation

## 2020-03-15 DIAGNOSIS — E119 Type 2 diabetes mellitus without complications: Secondary | ICD-10-CM | POA: Insufficient documentation

## 2020-03-15 NOTE — Progress Notes (Signed)
Subjective:  Patient ID: Katherine Richmond, female    DOB: [birth year]  Age: 79 y.o. MRN: [id number]  Chief Complaint  Patient presents with  . Hypertension  . Hyperlipidemia  . Diabetes  . 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 Patient's diabetes has been well controlled.  She does not check her sugars.  She denies excessive thirst, excessive urination, or excessive hunger.  She does check her feet daily and has no sores.  Her most recent diabetic exam was December/2020. Patient has hypertensive chronic kidney disease. Stage 3A. Denies chest pain or headaches.  Her blood pressure has been well controlled 120s over 80s.  She is currently on irbesartan/hydrochlorothiazide 150-12.5 mg once daily, metoprolol succinate 100 mg 1 p.o. daily.  For her hyperlipidemia she is currently on OTC fish oil 1200 mg once daily and Lipitor 40 mg once daily.  She also takes aspirin 81 mg once daily.  Patient was diagnosed with leiomyosarcoma of the retroperitoneum in 2019 and found to have liver metastases in 11/2019. All has been resected and is currently being monitored by oncology (Dr. Lavera Guise) and Dr. Crisoforo Oxford (general surgery.)  Past Medical History:  Diagnosis Date  . Anemia   . Chronic kidney disease   . Diabetes mellitus without complication ([place])   . Hyperlipidemia   . Hypertension   . Thyroid disease    Past Surgical History:  Procedure Laterality Date  . ABDOMINAL HYSTERECTOMY    . EYE SURGERY Right 11/2016   CATARACT REMOVAL  . EYE SURGERY Left 12/2016   CATARACT REMOVAL  . NEPHRECTOMY Right 05/13/2018  . TONSILLECTOMY  2002    Family History  Problem Relation Age of Onset  . Breast cancer Mother   . Colon cancer Mother   . Renal cancer Mother   . Liver cancer  Half-Brother   . Multiple sclerosis Half-Brother   . Hypertension Other   . Transient ischemic attack Other   . Glaucoma Other    Social History   Socioeconomic History  . Marital status: Married    Spouse name: Engineer, drilling  . Number of children: 1  . Years of education: Not on file  . Highest education level: Not on file  Occupational History  . Occupation: RETIRED  . Occupation: [employer], [place], [employer]  Tobacco Use  . Smoking status: Former Smoker    Types: Cigarettes    Quit date: 2012    Years since quitting: 9.3  . Smokeless tobacco: Never Used  Substance and Sexual Activity  . Alcohol use: Yes    Alcohol/week: 1.0 standard drinks    Types: 1 Glasses of wine per week    Comment: SOCIAL USE ONLY  . Drug use: Never  . Sexual activity: Not on file  Other Topics Concern  . Not on file  Social History Narrative  . Not on file   Social Determinants of Health   Financial Resource Strain:   . Difficulty of Paying Living Expenses:   Food Insecurity:   . Worried About Charity fundraiser in the Last Year:   . Arboriculturist in the Last Year:   Transportation Needs:   . Film/video editor (Medical):   Marland Kitchen Lack of Transportation (Non-Medical):   Physical Activity:   . Days of Exercise per Week:   . Minutes of  Exercise per Session:   Stress:   . Feeling of Stress :   Social Connections:   . Frequency of Communication with Friends and Family:   . Frequency of Social Gatherings with Friends and Family:   . Attends Religious Services:   . Active Member of Clubs or Organizations:   . Attends Archivist Meetings:   Marland Kitchen Marital Status:     Review of Systems  Constitutional: Negative for chills, diaphoresis, fatigue and fever.  HENT: Negative for congestion, ear pain, rhinorrhea and sore throat.   Respiratory: Negative for cough and shortness of breath.   Cardiovascular: Negative for chest pain.  Gastrointestinal: Negative for abdominal pain, constipation,  diarrhea, nausea and vomiting.  Endocrine: Negative for polydipsia, polyphagia and polyuria.  Genitourinary: Negative for dysuria and urgency.  Musculoskeletal: Negative for arthralgias, back pain and myalgias.  Neurological: Negative for dizziness, weakness, light-headedness and headaches.  Psychiatric/Behavioral: Negative for dysphoric mood. The patient is not nervous/anxious.      Objective:  BP 124/64   Pulse 60   Temp (!) 97.4 F (36.3 C)   Resp 16   Ht 5\' 6"  (1.676 m)   Wt 178 lb 9.6 oz (81 kg)   BMI 28.83 kg/m   BP/Weight 03/20/2020 [id number]  Systolic BP [id number]  Diastolic BP 64 72  Wt. (Lbs) 178.6 183.4  BMI 28.83 29.6    Physical Exam Vitals reviewed.  Constitutional:      General: She is not in acute distress.    Appearance: Normal appearance. She is obese.  Neck:     Thyroid: No thyroid mass.     Vascular: No carotid bruit.  Cardiovascular:     Rate and Rhythm: Normal rate and regular rhythm.     Pulses: Normal pulses.     Heart sounds: No murmur.  Pulmonary:     Effort: Pulmonary effort is normal. No respiratory distress.     Breath sounds: Normal breath sounds.  Abdominal:     General: Bowel sounds are normal.     Palpations: Abdomen is soft. There is no mass.     Tenderness: There is no abdominal tenderness.  Skin:    General: Skin is warm and dry.  Neurological:     Mental Status: She is alert and oriented to person, place, and time.     Cranial Nerves: No cranial nerve deficit.  Psychiatric:        Mood and Affect: Mood normal.        Behavior: Behavior normal.    Diabetic Foot Exam - Simple   Simple Foot Form Diabetic Foot exam was performed with the following findings: Yes 03/20/2020  9:41 AM  Visual Inspection No deformities, no ulcerations, no other skin breakdown bilaterally: Yes Sensation Testing Intact to touch and monofilament testing bilaterally: Yes Pulse Check Posterior Tibialis and Dorsalis pulse intact bilaterally:  Yes Comments        Lab Results  Component Value Date   WBC 6.2 10/05/2008   HGB 13.0 10/05/2008   HCT 38.4 10/05/2008   PLT 270 10/05/2008   GLUCOSE 106 (H) 10/05/2008   NA 139 10/05/2008   K 4.4 10/05/2008   CL 105 10/05/2008   CREATININE 0.80 10/05/2008   BUN 11 10/05/2008   CO2 28 10/05/2008   MICROALBUR 80 03/20/2020      Assessment & Plan:  1. Essential hypertension with CKD N18.3a. Well controlled.  No changes to medicines.  Continue to work on eating a healthy diet and  exercise.  Labs drawn today.  - Comprehensive metabolic panel  2. Thyroid disease (hypothyroidism) - TSH  3. Mixed hyperlipidemia Well controlled.  No changes to medicines.  Continue to work on eating a healthy diet and exercise.  Labs drawn today.  - Lipid panel  4. Combined hyperlipidemia associated with type 2 diabetes mellitus (HCC) Control: good Recommend check feet daily. Recommend annual eye exams. Medicines: no changes.  Continue to work on eating a healthy diet and exercise.  Labs drawn today.   - Hemoglobin A1c - CBC with Differential/Platelet - POCT UA - Microalbumin  5. 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 Liver metastases [facility]) Following with oncology  7. Overweight with body mass index (BMI) of 28 to 28.9 in adult  Recommend continue to work on eating healthy diet and exercise.  Follow-up: Return in about 3 months (around 06/20/2020) for fasting.  An After Visit Summary was printed and given to the patient.  Rochel Brome [facility] [phone]

## 2020-03-20 ENCOUNTER — Encounter: Payer: Self-pay | Admitting: Family Medicine

## 2020-03-20 ENCOUNTER — Other Ambulatory Visit: Payer: Self-pay

## 2020-03-20 ENCOUNTER — Ambulatory Visit: Payer: Medicare PPO | Admitting: Family Medicine

## 2020-03-20 VITALS — BP 124/64 | HR 60 | Temp 97.4°F | Resp 16 | Ht 66.0 in | Wt 178.6 lb

## 2020-03-20 DIAGNOSIS — C480 Malignant neoplasm of retroperitoneum: Secondary | ICD-10-CM

## 2020-03-20 DIAGNOSIS — Z6828 Body mass index (BMI) 28.0-28.9, adult: Secondary | ICD-10-CM

## 2020-03-20 DIAGNOSIS — E038 Other specified hypothyroidism: Secondary | ICD-10-CM

## 2020-03-20 DIAGNOSIS — Z6829 Body mass index (BMI) 29.0-29.9, adult: Secondary | ICD-10-CM | POA: Insufficient documentation

## 2020-03-20 DIAGNOSIS — I10 Essential (primary) hypertension: Secondary | ICD-10-CM

## 2020-03-20 DIAGNOSIS — C787 Secondary malignant neoplasm of liver and intrahepatic bile duct: Secondary | ICD-10-CM

## 2020-03-20 DIAGNOSIS — E1169 Type 2 diabetes mellitus with other specified complication: Secondary | ICD-10-CM | POA: Diagnosis not present

## 2020-03-20 DIAGNOSIS — E782 Mixed hyperlipidemia: Secondary | ICD-10-CM | POA: Diagnosis not present

## 2020-03-20 DIAGNOSIS — E663 Overweight: Secondary | ICD-10-CM

## 2020-03-20 DIAGNOSIS — N1831 Chronic kidney disease, stage 3a: Secondary | ICD-10-CM | POA: Insufficient documentation

## 2020-03-20 LAB — POCT UA - MICROALBUMIN: Microalbumin Ur, POC: 80 mg/L

## 2020-03-20 NOTE — Patient Instructions (Signed)
Have a great summer!

## 2020-03-21 LAB — HEMOGLOBIN A1C
Est. average glucose Bld gHb Est-mCnc: 137 mg/dL
Hgb A1c MFr Bld: 6.4 % — ABNORMAL HIGH (ref 4.8–5.6)

## 2020-03-21 LAB — CBC WITH DIFFERENTIAL/PLATELET
Basophils Absolute: 0 10*3/uL (ref 0.0–0.2)
Basos: 1 %
EOS (ABSOLUTE): 0.1 10*3/uL (ref 0.0–0.4)
Eos: 4 %
Hematocrit: 33.6 % — ABNORMAL LOW (ref 34.0–46.6)
Hemoglobin: 11.2 g/dL (ref 11.1–15.9)
Immature Grans (Abs): 0 10*3/uL (ref 0.0–0.1)
Immature Granulocytes: 0 %
Lymphocytes Absolute: 1.4 10*3/uL (ref 0.7–3.1)
Lymphs: 35 %
MCH: 28.7 pg (ref 26.6–33.0)
MCHC: 33.3 g/dL (ref 31.5–35.7)
MCV: 86 fL (ref 79–97)
Monocytes Absolute: 0.4 10*3/uL (ref 0.1–0.9)
Monocytes: 9 %
Neutrophils Absolute: 2 10*3/uL (ref 1.4–7.0)
Neutrophils: 51 %
Platelets: 156 10*3/uL (ref 150–450)
RBC: 3.9 x10E6/uL (ref 3.77–5.28)
RDW: 12.7 % (ref 11.7–15.4)
WBC: 3.9 10*3/uL (ref 3.4–10.8)

## 2020-03-21 LAB — COMPREHENSIVE METABOLIC PANEL
ALT: 17 IU/L (ref 0–32)
AST: 33 IU/L (ref 0–40)
Albumin/Globulin Ratio: 1.6 (ref 1.2–2.2)
Albumin: 4.4 g/dL (ref 3.7–4.7)
Alkaline Phosphatase: 113 IU/L (ref 48–121)
BUN/Creatinine Ratio: 9 — ABNORMAL LOW (ref 12–28)
BUN: 12 mg/dL (ref 8–27)
Bilirubin Total: 0.2 mg/dL (ref 0.0–1.2)
CO2: 20 mmol/L (ref 20–29)
Calcium: 9.8 mg/dL (ref 8.7–10.3)
Chloride: 102 mmol/L (ref 96–106)
Creatinine, Ser: 1.4 mg/dL — ABNORMAL HIGH (ref 0.57–1.00)
GFR calc Af Amer: 42 mL/min/{1.73_m2} — ABNORMAL LOW (ref >59)
GFR calc non Af Amer: 37 mL/min/{1.73_m2} — ABNORMAL LOW (ref >59)
Globulin, Total: 2.7 g/dL (ref 1.5–4.5)
Glucose: 107 mg/dL — ABNORMAL HIGH (ref 65–99)
Potassium: 5 mmol/L (ref 3.5–5.2)
Sodium: 140 mmol/L (ref 134–144)
Total Protein: 7.1 g/dL (ref 6.0–8.5)

## 2020-03-21 LAB — LIPID PANEL
Chol/HDL Ratio: 3.3 ratio (ref 0.0–4.4)
Cholesterol, Total: 193 mg/dL (ref 100–199)
HDL: 59 mg/dL (ref >39)
LDL Chol Calc (NIH): 103 mg/dL — ABNORMAL HIGH (ref 0–99)
Triglycerides: 182 mg/dL — ABNORMAL HIGH (ref 0–149)
VLDL Cholesterol Cal: 31 mg/dL (ref 5–40)

## 2020-03-21 LAB — CARDIOVASCULAR RISK ASSESSMENT

## 2020-03-21 LAB — TSH: TSH: 2.05 u[IU]/mL (ref 0.450–4.500)

## 2020-03-27 ENCOUNTER — Other Ambulatory Visit: Payer: Self-pay

## 2020-03-27 DIAGNOSIS — N189 Chronic kidney disease, unspecified: Secondary | ICD-10-CM

## 2020-03-27 DIAGNOSIS — D631 Anemia in chronic kidney disease: Secondary | ICD-10-CM

## 2020-03-28 ENCOUNTER — Other Ambulatory Visit: Payer: Self-pay | Admitting: Family Medicine

## 2020-04-21 ENCOUNTER — Other Ambulatory Visit: Payer: Self-pay

## 2020-05-16 ENCOUNTER — Other Ambulatory Visit: Payer: Self-pay | Admitting: Family Medicine

## 2020-06-05 ENCOUNTER — Other Ambulatory Visit: Payer: Self-pay | Admitting: Family Medicine

## 2020-06-08 ENCOUNTER — Other Ambulatory Visit: Payer: Self-pay

## 2020-06-08 MED ORDER — FLUTICASONE PROPIONATE 50 MCG/ACT NA SUSP: 2.0000 | Freq: Every day | NASAL | 1 refills | Status: DC

## 2020-06-19 ENCOUNTER — Other Ambulatory Visit: Payer: Self-pay | Admitting: Family Medicine

## 2020-06-26 ENCOUNTER — Other Ambulatory Visit: Payer: Self-pay

## 2020-06-26 ENCOUNTER — Ambulatory Visit: Payer: Medicare PPO | Admitting: Family Medicine

## 2020-06-26 ENCOUNTER — Encounter: Payer: Self-pay | Admitting: Family Medicine

## 2020-06-26 VITALS — BP 124/68 | HR 60 | Temp 97.2°F | Resp 16 | Ht 66.0 in | Wt 182.0 lb

## 2020-06-26 DIAGNOSIS — M791 Myalgia, unspecified site: Secondary | ICD-10-CM

## 2020-06-26 DIAGNOSIS — E1121 Type 2 diabetes mellitus with diabetic nephropathy: Secondary | ICD-10-CM | POA: Diagnosis not present

## 2020-06-26 DIAGNOSIS — C480 Malignant neoplasm of retroperitoneum: Secondary | ICD-10-CM

## 2020-06-26 DIAGNOSIS — E782 Mixed hyperlipidemia: Secondary | ICD-10-CM

## 2020-06-26 DIAGNOSIS — I129 Hypertensive chronic kidney disease with stage 1 through stage 4 chronic kidney disease, or unspecified chronic kidney disease: Secondary | ICD-10-CM

## 2020-06-26 DIAGNOSIS — N1831 Chronic kidney disease, stage 3a: Secondary | ICD-10-CM

## 2020-06-26 LAB — POCT UA - MICROALBUMIN: Microalbumin Ur, POC: 10 mg/L

## 2020-06-26 NOTE — Progress Notes (Signed)
Subjective:  Patient ID: Katherine Richmond, female    DOB: August 14, 1941  Age: 79 y.o. MRN: [id number]  Chief Complaint  Patient presents with  . Diabetes  . Hypertension  . 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  Patient's diabetes has been well controlled.  She does not check her sugars.  She denies excessive thirst, excessive urination, or excessive hunger.  She is eating healthy and exercising. She does check her feet daily and has no sores.  Her most recent diabetic exam was December/2020. Takes gabapentin once at night. Denies neuropathy.   Patient has hypertensive chronic kidney disease. Stage 3A. Denies chest pain or headaches.  Her blood pressure has been well controlled 120s over 80s.  She is currently on irbesartan/hydrochlorothiazide 150-12.5 mg once daily, metoprolol succinate 100 mg 1 p.o. daily.    For her hyperlipidemia she is currently on OTC fish oil 1200 mg once daily and Lipitor 40 mg once daily.  She also takes aspirin 81 mg once daily.  Patient was diagnosed with leiomyosarcoma of the retroperitoneum in 2019 and found to have liver metastases in 11/2019. All has been resected and is currently being monitored by oncology (Dr. Lavera Guise) and Dr. Crisoforo Oxford (general surgery.)  Past Medical History:  Diagnosis Date  . Anemia   . Cancer ([place]) 07/2018   Leiomyosarcoma  . Chronic kidney disease    N18.3a).  . Diabetes mellitus without complication ([place])   . Hyperlipidemia   . Hypertension   . 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 Thyroid disease    Past Surgical History:  Procedure Laterality Date  . ABDOMINAL HYSTERECTOMY    . EYE SURGERY Right 11/2016   CATARACT REMOVAL  . EYE SURGERY Left 12/2016   CATARACT REMOVAL  . FOOT SURGERY  Bilateral    bunionectomies  . NEPHRECTOMY Right 05/13/2018  . TONSILLECTOMY  2002    Family History  Problem Relation Age of Onset  . Breast cancer Mother   . Colon cancer Mother   . Renal cancer Mother   . Liver cancer Half-Brother   . Multiple sclerosis Half-Brother   . Hypertension Other   . Transient ischemic attack Other   . Glaucoma Other    Social History   Socioeconomic History  . Marital status: Married    Spouse name: Engineer, drilling  . Number of children: 1  . Years of education: Not on file  . Highest education level: Not on file  Occupational History  . Occupation: RETIRED  . Occupation: [employer], [place], [employer]  Tobacco Use  . Smoking status: Former Smoker    Types: Cigarettes    Quit date: 2012    Years since quitting: 9.6  . Smokeless tobacco: Never Used  Substance and Sexual Activity  . Alcohol use: Yes    Alcohol/week: 1.0 standard drink    Types: 1 Glasses of wine per week    Comment: SOCIAL USE ONLY  . Drug use: Never  . Sexual activity: Not on file  Other Topics Concern  . Not on file  Social History Narrative  . Not on file   Social Determinants of Health   Financial Resource Strain:   . Difficulty of Paying Living Expenses: Not on file  Food Insecurity:   . Worried About Charity fundraiser in the Last  Year: Not on file  . Ran Out of Food in the Last Year: Not on file  Transportation Needs:   . Lack of Transportation (Medical): Not on file  . Lack of Transportation (Non-Medical): Not on file  Physical Activity:   . Days of Exercise per Week: Not on file  . Minutes of Exercise per Session: Not on file  Stress:   . Feeling of Stress : Not on file  Social Connections:   . Frequency of Communication with Friends and Family: Not on file  . Frequency of Social Gatherings with Friends and Family: Not on file  . Attends Religious Services: Not on file  . Active Member of Clubs or Organizations: Not on file  . Attends Archivist  Meetings: Not on file  . Marital Status: Not on file    Review of Systems  Constitutional: Negative for chills, diaphoresis, fatigue and fever.  HENT: Negative for congestion, ear pain, rhinorrhea and sore throat.   Respiratory: Negative for cough and shortness of breath.   Cardiovascular: Negative for chest pain.  Gastrointestinal: Negative for abdominal pain, constipation, diarrhea, nausea and vomiting.  Endocrine: Negative for polydipsia, polyphagia and polyuria.  Genitourinary: Negative for dysuria and urgency.  Musculoskeletal: Negative for arthralgias, back pain and myalgias.  Neurological: Negative for dizziness, weakness, light-headedness and headaches.  Psychiatric/Behavioral: Negative for dysphoric mood. The patient is not nervous/anxious.      Objective:  BP 124/68   Pulse 60   Temp (!) 97.2 F (36.2 C)   Resp 16   Ht 5\' 6"  (1.676 m)   Wt 182 lb (82.6 kg)   BMI 29.38 kg/m   BP/Weight 06/26/2020 03/20/2020 3/47/4259  Systolic BP 563 875 643  Diastolic BP 68 64 72  Wt. (Lbs) 182 178.6 183.4  BMI 29.38 28.83 29.6    Physical Exam Vitals reviewed.  Constitutional:      General: She is not in acute distress.    Appearance: Normal appearance. She is obese.  Neck:     Thyroid: No thyroid mass.     Vascular: No carotid bruit.  Cardiovascular:     Rate and Rhythm: Normal rate and regular rhythm.     Pulses: Normal pulses.     Heart sounds: No murmur.  Pulmonary:     Effort: Pulmonary effort is normal. No respiratory distress.     Breath sounds: Normal breath sounds.  Abdominal:     General: Bowel sounds are normal.     Palpations: Abdomen is soft. There is no mass.     Tenderness: There is no abdominal tenderness.  Skin:    General: Skin is warm and dry.  Neurological:     Mental Status: She is alert and oriented to person, place, and time.     Cranial Nerves: No cranial nerve deficit.  Psychiatric:        Mood and Affect: Mood normal.        Behavior:  Behavior normal.    Diabetic Foot Exam - Simple   Simple Foot Form Visual Inspection No deformities, no ulcerations, no other skin breakdown bilaterally: Yes Sensation Testing Intact to touch and monofilament testing bilaterally: Yes Pulse Check Posterior Tibialis and Dorsalis pulse intact bilaterally: Yes Comments        Lab Results  Component Value Date   WBC 3.9 03/20/2020   HGB 11.2 03/20/2020   HCT 33.6 (L) 03/20/2020   PLT 156 03/20/2020   GLUCOSE 107 (H) 03/20/2020   CHOL 193 03/20/2020  TRIG 182 (H) 03/20/2020   HDL 59 03/20/2020   LDLCALC 103 (H) 03/20/2020   ALT 17 03/20/2020   AST 33 03/20/2020   NA 140 03/20/2020   K 5.0 03/20/2020   CL 102 03/20/2020   CREATININE 1.40 (H) 03/20/2020   BUN 12 03/20/2020   CO2 20 03/20/2020   TSH 2.050 03/20/2020   HGBA1C 6.4 (H) 03/20/2020   MICROALBUR 80 03/20/2020      Assessment & Plan:  1. Essential hypertension with CKD  Stage 3a. Well controlled.  No changes to medicines.  Continue to work on eating a healthy diet and exercise.  Labs drawn today.  - Comprehensive metabolic panel  2. Thyroid disease (hypothyroidism) - TSH  3. Mixed hyperlipidemia Well controlled.  No changes to medicines.  Continue to work on eating a healthy diet and exercise.  Labs drawn today.  - Lipid panel  4. Combined hyperlipidemia associated with type 2 diabetes mellitus (HCC) Control: good Recommend check feet daily. Recommend annual eye exams. Medicines: no changes.  Continue to work on eating a healthy diet and exercise.  Labs drawn today.   - Hemoglobin A1c - CBC with Differential/Platelet - POCT UA - Microalbumin  5. Retroperitoneal sarcoma [facility]) Following with oncology and surgeon.  6. myalgia. Check magnesium and phosphorus.   Follow-up: Return in about 6 months (around 12/27/2020) for chronic fasting follow up. .  An After Visit Summary was printed and given to the patient.  Rochel Brome [facility] [phone]

## 2020-06-27 LAB — CBC WITH DIFFERENTIAL/PLATELET
Basophils Absolute: 0 10*3/uL (ref 0.0–0.2)
Basos: 1 %
EOS (ABSOLUTE): 0.2 10*3/uL (ref 0.0–0.4)
Eos: 5 %
Hematocrit: 32.9 % — ABNORMAL LOW (ref 34.0–46.6)
Hemoglobin: 10.9 g/dL — ABNORMAL LOW (ref 11.1–15.9)
Immature Grans (Abs): 0 10*3/uL (ref 0.0–0.1)
Immature Granulocytes: 0 %
Lymphocytes Absolute: 2 10*3/uL (ref 0.7–3.1)
Lymphs: 43 %
MCH: 27.9 pg (ref 26.6–33.0)
MCHC: 33.1 g/dL (ref 31.5–35.7)
MCV: 84 fL (ref 79–97)
Monocytes Absolute: 0.4 10*3/uL (ref 0.1–0.9)
Monocytes: 9 %
Neutrophils Absolute: 1.9 10*3/uL (ref 1.4–7.0)
Neutrophils: 42 %
Platelets: 219 10*3/uL (ref 150–450)
RBC: 3.9 x10E6/uL (ref 3.77–5.28)
RDW: 13 % (ref 11.7–15.4)
WBC: 4.6 10*3/uL (ref 3.4–10.8)

## 2020-06-27 LAB — COMPREHENSIVE METABOLIC PANEL
ALT: 22 IU/L (ref 0–32)
AST: 25 IU/L (ref 0–40)
Albumin/Globulin Ratio: 1.9 (ref 1.2–2.2)
Albumin: 4.6 g/dL (ref 3.7–4.7)
Alkaline Phosphatase: 96 IU/L (ref 48–121)
BUN/Creatinine Ratio: 10 — ABNORMAL LOW (ref 12–28)
BUN: 15 mg/dL (ref 8–27)
Bilirubin Total: 0.2 mg/dL (ref 0.0–1.2)
CO2: 26 mmol/L (ref 20–29)
Calcium: 9.6 mg/dL (ref 8.7–10.3)
Chloride: 100 mmol/L (ref 96–106)
Creatinine, Ser: 1.45 mg/dL — ABNORMAL HIGH (ref 0.57–1.00)
GFR calc Af Amer: 40 mL/min/{1.73_m2} — ABNORMAL LOW (ref >59)
GFR calc non Af Amer: 35 mL/min/{1.73_m2} — ABNORMAL LOW (ref >59)
Globulin, Total: 2.4 g/dL (ref 1.5–4.5)
Glucose: 106 mg/dL — ABNORMAL HIGH (ref 65–99)
Potassium: 4.3 mmol/L (ref 3.5–5.2)
Sodium: 138 mmol/L (ref 134–144)
Total Protein: 7 g/dL (ref 6.0–8.5)

## 2020-06-27 LAB — CARDIOVASCULAR RISK ASSESSMENT

## 2020-06-27 LAB — HEMOGLOBIN A1C
Est. average glucose Bld gHb Est-mCnc: 140 mg/dL
Hgb A1c MFr Bld: 6.5 % — ABNORMAL HIGH (ref 4.8–5.6)

## 2020-06-27 LAB — LIPID PANEL
Chol/HDL Ratio: 3.3 ratio (ref 0.0–4.4)
Cholesterol, Total: 180 mg/dL (ref 100–199)
HDL: 55 mg/dL (ref >39)
LDL Chol Calc (NIH): 96 mg/dL (ref 0–99)
Triglycerides: 170 mg/dL — ABNORMAL HIGH (ref 0–149)
VLDL Cholesterol Cal: 29 mg/dL (ref 5–40)

## 2020-06-27 LAB — PHOSPHORUS: Phosphorus: 3.2 mg/dL (ref 3.0–4.3)

## 2020-06-27 LAB — MAGNESIUM: Magnesium: 2 mg/dL (ref 1.6–2.3)

## 2020-07-03 ENCOUNTER — Other Ambulatory Visit: Payer: Self-pay | Admitting: Physician Assistant

## 2020-07-10 ENCOUNTER — Other Ambulatory Visit: Payer: Self-pay | Admitting: Family Medicine

## 2020-08-01 ENCOUNTER — Encounter: Payer: Self-pay | Admitting: Family Medicine

## 2020-08-01 ENCOUNTER — Ambulatory Visit (INDEPENDENT_AMBULATORY_CARE_PROVIDER_SITE_OTHER): Payer: Medicare PPO

## 2020-08-01 DIAGNOSIS — Z23 Encounter for immunization: Secondary | ICD-10-CM

## 2020-08-03 DIAGNOSIS — Z23 Encounter for immunization: Secondary | ICD-10-CM | POA: Diagnosis not present

## 2020-08-22 ENCOUNTER — Other Ambulatory Visit: Payer: Self-pay | Admitting: Physician Assistant

## 2020-09-12 DIAGNOSIS — H9193 Unspecified hearing loss, bilateral: Secondary | ICD-10-CM | POA: Diagnosis not present

## 2020-09-12 DIAGNOSIS — Z974 Presence of external hearing-aid: Secondary | ICD-10-CM | POA: Diagnosis not present

## 2020-09-12 DIAGNOSIS — H6123 Impacted cerumen, bilateral: Secondary | ICD-10-CM | POA: Diagnosis not present

## 2020-09-12 DIAGNOSIS — H61303 Acquired stenosis of external ear canal, unspecified, bilateral: Secondary | ICD-10-CM | POA: Diagnosis not present

## 2020-09-25 ENCOUNTER — Other Ambulatory Visit: Payer: Self-pay | Admitting: Oncology

## 2020-09-25 DIAGNOSIS — D631 Anemia in chronic kidney disease: Secondary | ICD-10-CM

## 2020-09-25 NOTE — Progress Notes (Signed)
[place]  [address] [place],  [place]  [address] [phone]  Clinic Day:  09/26/2020  Referring physician: Rochel Brome, MD   HISTORY OF PRESENT ILLNESS:  The patient is a 79 y.o. female with anemia secondary to previous iron deficiency.  Labs have also shown renal insufficiency factoring into her anemia.  She comes in today for routine follow-up.  Since her last visit, the patient has been doing well.  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  As this same disease encompassed her right kidney, this organ was removed in 2019, likely being the reason behind why she has renal insufficiency factoring into her anemia.  She claims scans done at [facility] a few months showed small, nonspecific pulmonary nodules which will be followed next year.  She denies having increased fatigue or any overt forms of blood loss which concern her for progressive anemia.     PHYSICAL EXAM:  Blood pressure (!) 158/72, pulse (!) 56, temperature 97.8 F (36.6 C), resp. rate 16, height 5\' 6"  (1.676 m), weight 182 lb 14.4 oz (83 kg), SpO2 99 %. Wt Readings from Last 3 Encounters:  09/26/20 182 lb 14.4 oz (83 kg)  06/26/20 182 lb (82.6 kg)  03/20/20 178 lb 9.6 oz (81 kg)   Body mass index is 29.52 kg/m. Performance status (ECOG): 0 - Asymptomatic Physical Exam Constitutional:      Appearance: Normal appearance. She is not ill-appearing.  HENT:     Mouth/Throat:     Mouth: Mucous membranes are moist.     Pharynx: Oropharynx is clear. No oropharyngeal exudate or posterior oropharyngeal erythema.  Cardiovascular:     Rate and Rhythm: Normal rate and regular rhythm.     Heart sounds: No murmur heard.  No friction rub. No gallop.   Pulmonary:     Effort: Pulmonary effort is normal. No respiratory distress.     Breath sounds: Normal breath sounds. No wheezing, rhonchi or rales.  Abdominal:     General: Bowel  sounds are normal. There is no distension.     Palpations: Abdomen is soft. There is no mass.     Tenderness: There is no abdominal tenderness.  Musculoskeletal:        General: No swelling.     Right lower leg: No edema.     Left lower leg: No edema.  Lymphadenopathy:     Cervical: No cervical adenopathy.     Upper Body:     Right upper body: No supraclavicular or axillary adenopathy.     Left upper body: No supraclavicular or axillary adenopathy.     Lower Body: No right inguinal adenopathy. No left inguinal adenopathy.  Skin:    General: Skin is warm.     Coloration: Skin is not jaundiced.     Findings: No lesion or rash.  Neurological:     General: No focal deficit present.     Mental Status: She is alert and oriented to person, place, and time. Mental status is at baseline.     Cranial Nerves: Cranial nerves are intact.  Psychiatric:        Mood and Affect: Mood normal.        Behavior: Behavior normal.        Thought Content: Thought content normal.     LABS:      ASSESSMENT & PLAN:  Assessment/Plan:  A 79 y.o. female with a history  of mild anemia secondary to iron deficiency and mild renal insufficiency.  When evaluating her labs today, her hemoglobin remains stable at 11.  Her renal function is only minimally off.  As her hemoglobin remains above 10, she will continue to be followed conservatively.  I will see her back in 6 months for repeat clinical assessment. The patient understands all the plans discussed today and is in agreement with them.      Taelar Gronewold Macarthur Critchley, MD

## 2020-09-26 ENCOUNTER — Inpatient Hospital Stay: Payer: Medicare PPO | Attending: Oncology

## 2020-09-26 ENCOUNTER — Other Ambulatory Visit: Payer: Self-pay | Admitting: Hematology and Oncology

## 2020-09-26 ENCOUNTER — Inpatient Hospital Stay (INDEPENDENT_AMBULATORY_CARE_PROVIDER_SITE_OTHER): Payer: Medicare PPO | Admitting: Oncology

## 2020-09-26 ENCOUNTER — Other Ambulatory Visit: Payer: Self-pay

## 2020-09-26 ENCOUNTER — Telehealth: Payer: Self-pay | Admitting: Oncology

## 2020-09-26 VITALS — BP 158/72 | HR 56 | Temp 97.8°F | Resp 16 | Ht 66.0 in | Wt 182.9 lb

## 2020-09-26 DIAGNOSIS — N189 Chronic kidney disease, unspecified: Secondary | ICD-10-CM | POA: Diagnosis not present

## 2020-09-26 DIAGNOSIS — D649 Anemia, unspecified: Secondary | ICD-10-CM | POA: Diagnosis not present

## 2020-09-26 DIAGNOSIS — D509 Iron deficiency anemia, unspecified: Secondary | ICD-10-CM | POA: Insufficient documentation

## 2020-09-26 DIAGNOSIS — D631 Anemia in chronic kidney disease: Secondary | ICD-10-CM | POA: Diagnosis not present

## 2020-09-26 DIAGNOSIS — Z8589 Personal history of malignant neoplasm of other organs and systems: Secondary | ICD-10-CM | POA: Diagnosis not present

## 2020-09-26 DIAGNOSIS — Z0001 Encounter for general adult medical examination with abnormal findings: Secondary | ICD-10-CM | POA: Diagnosis not present

## 2020-09-26 LAB — BASIC METABOLIC PANEL
BUN: 18 (ref 4–21)
CO2: 27 — AB (ref 13–22)
Chloride: 103 (ref 99–108)
Creatinine: 1.4 — AB (ref 0.5–1.1)
Glucose: 110
Potassium: 4.2 (ref 3.4–5.3)
Sodium: 137 (ref 137–147)

## 2020-09-26 LAB — HEPATIC FUNCTION PANEL
ALT: 21 (ref 7–35)
AST: 34 (ref 13–35)
Alkaline Phosphatase: 89 (ref 25–125)
Bilirubin, Total: 0.4

## 2020-09-26 LAB — FERRITIN: Ferritin: 84 ng/mL (ref 11–307)

## 2020-09-26 LAB — CBC AND DIFFERENTIAL
HCT: 34 — AB (ref 36–46)
Hemoglobin: 11 — AB (ref 12.0–16.0)
Neutrophils Absolute: 1.44
Platelets: 218 (ref 150–399)
WBC: 4.1

## 2020-09-26 LAB — IRON AND TIBC
Iron: 81 ug/dL (ref 28–170)
Saturation Ratios: 24 % (ref 10.4–31.8)
TIBC: 333 ug/dL (ref 250–450)
UIBC: 252 ug/dL

## 2020-09-26 LAB — COMPREHENSIVE METABOLIC PANEL
Albumin: 4.3 (ref 3.5–5.0)
Calcium: 9.5 (ref 8.7–10.7)

## 2020-09-26 LAB — CBC: RBC: 3.92 (ref 3.87–5.11)

## 2020-09-26 NOTE — Telephone Encounter (Signed)
Per 11/23 los next appt given to patient

## 2020-11-28 ENCOUNTER — Other Ambulatory Visit: Payer: Self-pay | Admitting: Family Medicine

## 2020-11-28 ENCOUNTER — Other Ambulatory Visit: Payer: Self-pay | Admitting: Physician Assistant

## 2020-12-11 DIAGNOSIS — E119 Type 2 diabetes mellitus without complications: Secondary | ICD-10-CM | POA: Diagnosis not present

## 2020-12-12 ENCOUNTER — Other Ambulatory Visit: Payer: Self-pay | Admitting: Family Medicine

## 2020-12-12 DIAGNOSIS — R1909 Other intra-abdominal and pelvic swelling, mass and lump: Secondary | ICD-10-CM | POA: Diagnosis not present

## 2020-12-12 DIAGNOSIS — Z9049 Acquired absence of other specified parts of digestive tract: Secondary | ICD-10-CM | POA: Diagnosis not present

## 2020-12-12 DIAGNOSIS — R590 Localized enlarged lymph nodes: Secondary | ICD-10-CM | POA: Diagnosis not present

## 2020-12-12 DIAGNOSIS — E896 Postprocedural adrenocortical (-medullary) hypofunction: Secondary | ICD-10-CM | POA: Diagnosis not present

## 2020-12-12 DIAGNOSIS — Z08 Encounter for follow-up examination after completed treatment for malignant neoplasm: Secondary | ICD-10-CM | POA: Diagnosis not present

## 2020-12-12 DIAGNOSIS — R918 Other nonspecific abnormal finding of lung field: Secondary | ICD-10-CM | POA: Diagnosis not present

## 2020-12-12 DIAGNOSIS — C480 Malignant neoplasm of retroperitoneum: Secondary | ICD-10-CM | POA: Diagnosis not present

## 2020-12-12 DIAGNOSIS — Z905 Acquired absence of kidney: Secondary | ICD-10-CM | POA: Diagnosis not present

## 2020-12-12 DIAGNOSIS — Z8589 Personal history of malignant neoplasm of other organs and systems: Secondary | ICD-10-CM | POA: Diagnosis not present

## 2021-01-01 NOTE — Progress Notes (Deleted)
Subjective:  Patient ID: Katherine Richmond, female    DOB: Jan 10, 1941  Age: 80 y.o. MRN: [id number]  No chief complaint on file.   HPI  Mixed hyperlipidemia - Patient is taking atorvastatin 40 mg daily, Omega 3 fish oil daily. Stage 3a chronic kidney disease  Essential hypertension - Patient currently in Metoprolol 100mg  daily, Irbesartan-Hctz 150-12.5 mg daily, aspirin 81 mg daily.  Retroperitoneal sarcoma  Secondary hypothyroidism - Patient takes levothyroxine 50 mcg daily.  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 aspirin 81 MG EC tablet Take by mouth.    Marland Kitchen atorvastatin (LIPITOR) 40 MG tablet TAKE ONE TABLET BY MOUTH EVERY DAY 90 tablet 1  . estradiol (ESTRACE) 1 MG tablet TAKE 1 TABLET BY MOUTH ONCE DAILY. NEEDS TO CALL FOR APPT SOON    . ferrous sulfate 325 (65 FE) MG tablet Take by mouth.    . fluticasone (FLONASE) 50 MCG/ACT nasal spray Place 2 sprays into both nostrils daily. 48 mL 1  . gabapentin (NEURONTIN) 300 MG capsule Take 1 capsule (300 mg total) by mouth at bedtime. 90 capsule 0  . irbesartan-hydrochlorothiazide (AVALIDE) 150-12.5 MG tablet TAKE ONE TABLET BY MOUTH DAILY 90 tablet 0  . levothyroxine (SYNTHROID) 50 MCG tablet TAKE ONE TABLET BY MOUTH DAILY 90 tablet 1  . metFORMIN (GLUCOPHAGE) 1000 MG tablet TAKE ONE TABLET BY MOUTH EVERY DAY 90 tablet 1  . metoprolol succinate (TOPROL-XL) 100 MG 24 hr tablet Take 1 tablet (100 mg total) by mouth daily. 90 tablet 1  . Multiple Vitamins-Minerals (MULTIPLE VITAMINS/WOMENS PO) Take by mouth.    . Omega-3 Fatty Acids (OMEGA-3 FISH OIL) 1200 MG CAPS Take by mouth.    . traZODone (DESYREL) 50 MG tablet TAKE ONE TABLET BY MOUTH EVERY DAY AT BEDTIME 90 tablet 1   No current facility-administered medications on file prior to  visit.   Past Medical History:  Diagnosis Date  . Anemia   . Cancer ([place]) 07/2018   Leiomyosarcoma  . Chronic kidney disease    N18.3a).  . Diabetes mellitus without complication ([place])   . Hyperlipidemia   . Hypertension   . 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 Thyroid disease    Past Surgical History:  Procedure Laterality Date  . ABDOMINAL HYSTERECTOMY    . EYE SURGERY Right 11/2016   CATARACT REMOVAL  . EYE SURGERY Left 12/2016   CATARACT REMOVAL  . FOOT SURGERY Bilateral    bunionectomies  . NEPHRECTOMY Right 05/13/2018  . TONSILLECTOMY  2002    Family History  Problem Relation Age of Onset  . Breast cancer Mother   . Colon cancer Mother   . Renal cancer Mother   . Liver cancer Half-Brother   . Multiple sclerosis Half-Brother   . Hypertension Other   . Transient ischemic attack Other   . Glaucoma Other    Social History   Socioeconomic History  . Marital status: Married    Spouse name: Engineer, drilling  . Number of children: 1  . Years of education: Not on file  . Highest education level: Not on file  Occupational History  . Occupation: RETIRED  . Occupation: [employer], [place], [employer]  Tobacco Use  . Smoking status: Former Smoker    Types: Cigarettes    Quit date: 2012  Years since quitting: 10.1  . Smokeless tobacco: Never Used  Substance and Sexual Activity  . Alcohol use: Yes    Alcohol/week: 1.0 standard drink    Types: 1 Glasses of wine per week    Comment: SOCIAL USE ONLY  . Drug use: Never  . Sexual activity: Not on file  Other Topics Concern  . Not on file  Social History Narrative  . Not on file   Social Determinants of Health   Financial Resource Strain: Not on file  Food Insecurity: Not on file  Transportation Needs: Not on file  Physical Activity: Not on file  Stress: Not on file  Social Connections: Not on file    Review of Systems  Constitutional: Negative for chills, fatigue and fever.  HENT: Negative for  congestion, ear pain and sore throat.   Respiratory: Negative for cough and shortness of breath.   Cardiovascular: Negative for chest pain and palpitations.  Gastrointestinal: Negative for abdominal pain, constipation, diarrhea, nausea and vomiting.  Endocrine: Negative for polydipsia, polyphagia and polyuria.  Genitourinary: Negative for difficulty urinating and dysuria.  Musculoskeletal: Negative for arthralgias, back pain and myalgias.  Skin: Negative for rash.  Neurological: Negative for headaches.  Psychiatric/Behavioral: Negative for dysphoric mood. The patient is not nervous/anxious.      Objective:  There were no vitals taken for this visit.  BP/Weight 09/26/2020 06/26/2020 1/61/0960  Systolic BP 454 098 119  Diastolic BP 72 68 64  Wt. (Lbs) 182.9 182 178.6  BMI 29.52 29.38 28.83    Physical Exam Vitals reviewed.  Constitutional:      Appearance: Normal appearance.  Neck:     Vascular: No carotid bruit.  Cardiovascular:     Rate and Rhythm: Normal rate and regular rhythm.     Pulses: Normal pulses.     Heart sounds: Normal heart sounds.  Pulmonary:     Effort: Pulmonary effort is normal.     Breath sounds: Normal breath sounds.  Abdominal:     General: Bowel sounds are normal.     Palpations: Abdomen is soft.     Tenderness: There is no abdominal tenderness.  Neurological:     Mental Status: She is alert and oriented to person, place, and time.  Psychiatric:        Mood and Affect: Mood normal.        Behavior: Behavior normal.     Diabetic Foot Exam - Simple   No data filed      Lab Results  Component Value Date   WBC 4.1 09/26/2020   HGB 11.0 (A) 09/26/2020   HCT 34 (A) 09/26/2020   PLT 218 09/26/2020   GLUCOSE 106 (H) 06/26/2020   CHOL 180 06/26/2020   TRIG 170 (H) 06/26/2020   HDL 55 06/26/2020   LDLCALC 96 06/26/2020   ALT 21 09/26/2020   AST 34 09/26/2020   NA 137 09/26/2020   K 4.2 09/26/2020   CL 103 09/26/2020   CREATININE 1.4 (A)  09/26/2020   BUN 18 09/26/2020   CO2 27 (A) 09/26/2020   TSH 2.050 03/20/2020   HGBA1C 6.5 (H) 06/26/2020   MICROALBUR 10 06/26/2020      Assessment & Plan:   1. 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 Mixed hyperlipidemia  3. Stage 3a chronic kidney disease ([place])  4. Essential hypertension  5. Retroperitoneal sarcoma ([place])  6. Secondary hypothyroidism  7. Diabetic glomerulopathy ([place])    No orders of the  defined types were placed in this encounter.   No orders of the defined types were placed in this encounter.     I spent < time > minutes dedicated to the care of this patient on the date of this encounter to include face-to-face time with the patient, as well as: ***  Follow-up: No follow-ups on file.  An After Visit Summary was printed and given to the patient.  Rochel Brome, MD [facility] [phone]

## 2021-01-02 ENCOUNTER — Ambulatory Visit: Payer: Medicare PPO | Admitting: Family Medicine

## 2021-01-02 DIAGNOSIS — E038 Other specified hypothyroidism: Secondary | ICD-10-CM

## 2021-01-02 DIAGNOSIS — E782 Mixed hyperlipidemia: Secondary | ICD-10-CM

## 2021-01-02 DIAGNOSIS — C480 Malignant neoplasm of retroperitoneum: Secondary | ICD-10-CM

## 2021-01-02 DIAGNOSIS — E1121 Type 2 diabetes mellitus with diabetic nephropathy: Secondary | ICD-10-CM

## 2021-01-02 DIAGNOSIS — I129 Hypertensive chronic kidney disease with stage 1 through stage 4 chronic kidney disease, or unspecified chronic kidney disease: Secondary | ICD-10-CM

## 2021-01-02 DIAGNOSIS — I10 Essential (primary) hypertension: Secondary | ICD-10-CM

## 2021-01-02 DIAGNOSIS — N1831 Chronic kidney disease, stage 3a: Secondary | ICD-10-CM

## 2021-01-15 ENCOUNTER — Other Ambulatory Visit: Payer: Self-pay | Admitting: Family Medicine

## 2021-01-23 ENCOUNTER — Ambulatory Visit: Payer: Medicare PPO | Admitting: Family Medicine

## 2021-01-23 ENCOUNTER — Other Ambulatory Visit: Payer: Self-pay

## 2021-01-23 ENCOUNTER — Telehealth: Payer: Self-pay | Admitting: Oncology

## 2021-01-23 VITALS — BP 132/68 | HR 64 | Temp 97.2°F | Ht 66.0 in | Wt 177.0 lb

## 2021-01-23 DIAGNOSIS — I129 Hypertensive chronic kidney disease with stage 1 through stage 4 chronic kidney disease, or unspecified chronic kidney disease: Secondary | ICD-10-CM | POA: Diagnosis not present

## 2021-01-23 DIAGNOSIS — D631 Anemia in chronic kidney disease: Secondary | ICD-10-CM

## 2021-01-23 DIAGNOSIS — E1121 Type 2 diabetes mellitus with diabetic nephropathy: Secondary | ICD-10-CM | POA: Diagnosis not present

## 2021-01-23 DIAGNOSIS — C787 Secondary malignant neoplasm of liver and intrahepatic bile duct: Secondary | ICD-10-CM

## 2021-01-23 DIAGNOSIS — Z905 Acquired absence of kidney: Secondary | ICD-10-CM

## 2021-01-23 DIAGNOSIS — C480 Malignant neoplasm of retroperitoneum: Secondary | ICD-10-CM

## 2021-01-23 DIAGNOSIS — Z6828 Body mass index (BMI) 28.0-28.9, adult: Secondary | ICD-10-CM | POA: Diagnosis not present

## 2021-01-23 DIAGNOSIS — E038 Other specified hypothyroidism: Secondary | ICD-10-CM

## 2021-01-23 DIAGNOSIS — E782 Mixed hyperlipidemia: Secondary | ICD-10-CM

## 2021-01-23 DIAGNOSIS — N1831 Chronic kidney disease, stage 3a: Secondary | ICD-10-CM | POA: Diagnosis not present

## 2021-01-23 NOTE — Telephone Encounter (Signed)
Patient rescheduled 5/24 Labs, Follow Up to 5/23 Labs 9:45 am - Follow Up 10:15 am

## 2021-01-23 NOTE — Progress Notes (Signed)
Subjective:  Patient ID: Katherine Richmond, female    DOB: 15-Aug-1941  Age: 80 y.o. MRN: [id number]  No chief complaint on file.   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  Diabetes has been well controlled.  She works very hard on eating healthy and exercising.  She does not check her sugars.  She is currently on metformin.  She also takes gabapentin.  She checks her feet daily.  And has a diabetic eye exam annually.  Hypertensive chronic kidney disease stage IIIa: Blood pressures well controlled 130s over 60s.  Currently on irbesartan hydrochlorothiazide 150/12.5 mg daily and metoprolol 100 mg daily.  Hypothyroidism: Currently on Synthroid 50 mcg once daily.  Hyperlipidemia: Currently on Lipitor 40 mg once daily, fish oil OTC 1200 mg daily, and aspirin 81 mg once daily.  Insomnia currently takes trazodone 50 mg once at night as needed insomnia.  Leiomyosarcoma of the retroperitoneum: Diagnosed in 2019.  She was found to have liver metastases in January 2021 she is followed by Dr. Bobby Rumpf (oncology) and Dr. Crisoforo Oxford (general oncology surgery.)  Patient underwent a right nephrectomy as the disease encompassed her kidney.  Patient also has anemia which may be secondary to her renal insufficiency. Her Lewis is monitoring her anemia.  Current Outpatient Medications on File Prior to Visit  Medication Sig Dispense Refill  . aspirin 81 MG EC tablet Take by mouth.    Marland Kitchen atorvastatin (LIPITOR) 40 MG tablet TAKE ONE TABLET BY MOUTH EVERY DAY 90 tablet 1  . estradiol (ESTRACE) 1 MG tablet TAKE 1 TABLET BY MOUTH ONCE DAILY. NEEDS TO CALL FOR APPT SOON    . ferrous sulfate 325 (65 FE) MG tablet Take by mouth.    . fluticasone (FLONASE) 50 MCG/ACT nasal spray Place 2 sprays into both nostrils daily. 48 mL 1  . gabapentin  (NEURONTIN) 300 MG capsule TAKE ONE CAPSULE BY MOUTH EVERY DAY AT BEDTIME 90 capsule 3  . irbesartan-hydrochlorothiazide (AVALIDE) 150-12.5 MG tablet TAKE ONE TABLET BY MOUTH DAILY 90 tablet 0  . levothyroxine (SYNTHROID) 50 MCG tablet TAKE ONE TABLET BY MOUTH DAILY 90 tablet 1  . metFORMIN (GLUCOPHAGE) 1000 MG tablet TAKE ONE TABLET BY MOUTH EVERY DAY 90 tablet 1  . Multiple Vitamins-Minerals (MULTIPLE VITAMINS/WOMENS PO) Take by mouth.    . Omega-3 Fatty Acids (OMEGA-3 FISH OIL) 1200 MG CAPS Take by mouth.    . traZODone (DESYREL) 50 MG tablet TAKE ONE TABLET BY MOUTH EVERY DAY AT BEDTIME 90 tablet 1   No current facility-administered medications on file prior to visit.   Past Medical History:  Diagnosis Date  . Anemia   . Cancer ([place]) 07/2018   Leiomyosarcoma  . Chronic kidney disease    N18.3a).  . Diabetes mellitus without complication ([place])   . Hyperlipidemia   . Hypertension   . 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 Thyroid disease    Past Surgical History:  Procedure Laterality Date  . ABDOMINAL HYSTERECTOMY    . EYE SURGERY Right 11/2016   CATARACT REMOVAL  . EYE SURGERY Left 12/2016   CATARACT REMOVAL  . FOOT SURGERY Bilateral    bunionectomies  . NEPHRECTOMY Right 05/13/2018  . TONSILLECTOMY  2002    Family History  Problem Relation Age of Onset  . Breast cancer Mother   . Colon cancer Mother   . Renal cancer  Mother   . Liver cancer Half-Brother   . Multiple sclerosis Half-Brother   . Hypertension Other   . Transient ischemic attack Other   . Glaucoma Other    Social History   Socioeconomic History  . Marital status: Married    Spouse name: Engineer, drilling  . Number of children: 1  . Years of education: Not on file  . Highest education level: Not on file  Occupational History  . Occupation: RETIRED  . Occupation: [employer], [place], [employer]  Tobacco Use  . Smoking status: Former Smoker    Types: Cigarettes    Quit date: 2012    Years  since quitting: 10.2  . Smokeless tobacco: Never Used  Substance and Sexual Activity  . Alcohol use: Yes    Alcohol/week: 1.0 standard drink    Types: 1 Glasses of wine per week    Comment: SOCIAL USE ONLY  . Drug use: Never  . Sexual activity: Not on file  Other Topics Concern  . Not on file  Social History Narrative  . Not on file   Social Determinants of Health   Financial Resource Strain: Not on file  Food Insecurity: Not on file  Transportation Needs: Not on file  Physical Activity: Not on file  Stress: Not on file  Social Connections: Not on file    Review of Systems  Constitutional: Negative for chills, fatigue and fever.  HENT: Negative for congestion, rhinorrhea and sore throat.   Respiratory: Negative for cough and shortness of breath.   Cardiovascular: Negative for chest pain.  Gastrointestinal: Negative for abdominal pain, constipation, diarrhea, nausea and vomiting.  Genitourinary: Negative for dysuria and urgency.  Musculoskeletal: Negative for back pain and myalgias.  Neurological: Negative for dizziness, weakness, light-headedness and headaches.  Psychiatric/Behavioral: Negative for dysphoric mood. The patient is not nervous/anxious.      Objective:  BP 132/68   Pulse 64   Temp (!) 97.2 F (36.2 C)   Ht 5\' 6"  (1.676 m)   Wt 177 lb (80.3 kg)   BMI 28.57 kg/m   BP/Weight 01/23/2021 09/26/2020 2/54/2706  Systolic BP 237 628 315  Diastolic BP 68 72 68  Wt. (Lbs) 177 182.9 182  BMI 28.57 29.52 29.38    Physical Exam Vitals reviewed.  Constitutional:      Appearance: Normal appearance. She is normal weight.  Neck:     Vascular: No carotid bruit.  Cardiovascular:     Rate and Rhythm: Normal rate and regular rhythm.     Pulses: Normal pulses.     Heart sounds: Normal heart sounds.  Pulmonary:     Effort: Pulmonary effort is normal. No respiratory distress.     Breath sounds: Normal breath sounds.  Abdominal:     General: Abdomen is flat.  Bowel sounds are normal.     Palpations: Abdomen is soft.     Tenderness: There is no abdominal tenderness.  Musculoskeletal:     Right lower leg: No edema.     Left lower leg: No edema.  Neurological:     Mental Status: She is alert and oriented to person, place, and time.  Psychiatric:        Mood and Affect: Mood normal.        Behavior: Behavior normal.     Diabetic Foot Exam - Simple   Simple Foot Form Diabetic Foot exam was performed with the following findings: Yes 01/23/2021  4:23 PM  Visual Inspection No deformities, no ulcerations, no  other skin breakdown bilaterally: Yes Sensation Testing Intact to touch and monofilament testing bilaterally: Yes Pulse Check Posterior Tibialis and Dorsalis pulse intact bilaterally: Yes Comments      Lab Results  Component Value Date   WBC 4.5 01/23/2021   HGB 11.1 01/23/2021   HCT 33.9 (L) 01/23/2021   PLT 263 01/23/2021   GLUCOSE 111 (H) 01/23/2021   CHOL 167 01/23/2021   TRIG 165 (H) 01/23/2021   HDL 55 01/23/2021   LDLCALC 84 01/23/2021   ALT 21 01/23/2021   AST 30 01/23/2021   NA 140 01/23/2021   K 4.5 01/23/2021   CL 101 01/23/2021   CREATININE 1.34 (H) 01/23/2021   BUN 12 01/23/2021   CO2 20 01/23/2021   TSH 0.797 01/23/2021   HGBA1C 6.2 (H) 01/23/2021   MICROALBUR 10 06/26/2020      Assessment & Plan:   1. 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 Check labs - CBC with Differential/Platelet - Comprehensive metabolic panel - Lipid panel  2. Secondary hypothyroidism - TSH  3. Diabetic glomerulopathy ([facility]) Control: good Recommend check feet daily. Recommend annual eye exams. Medicines: no changes Continue to work on eating a healthy diet and exercise.  Labs drawn today.   - Hemoglobin A1c  4. Mixed hyperlipidemia Well controlled.  No changes to medicines.  Continue to work on eating a  healthy diet and exercise.  Labs drawn today.   5. Leiomyosarcoma of retroperitoneum ([place]) Continue to follow up with Dr. Crisoforo Oxford.  Resected as much as possible.   6. Liver metastases ([place])  Follow with dr. Bobby Rumpf and Dr. Crisoforo Oxford  7. Anemia - multifactorial.   8. Right nephrectomy  No orders of the defined types were placed in this encounter.   Orders Placed This Encounter  Procedures  . CBC with Differential/Platelet  . Comprehensive metabolic panel  . Hemoglobin A1c  . Lipid panel  . TSH  . Cardiovascular Risk Assessment    Follow-up: Return in about 6 months (around 07/26/2021) for fasting.  An After Visit Summary was printed and given to the patient.  Rochel Brome, MD [facility] [phone]

## 2021-01-24 LAB — CBC WITH DIFFERENTIAL/PLATELET
Basophils Absolute: 0 10*3/uL (ref 0.0–0.2)
Basos: 1 %
EOS (ABSOLUTE): 0.2 10*3/uL (ref 0.0–0.4)
Eos: 5 %
Hematocrit: 33.9 % — ABNORMAL LOW (ref 34.0–46.6)
Hemoglobin: 11.1 g/dL (ref 11.1–15.9)
Immature Grans (Abs): 0 10*3/uL (ref 0.0–0.1)
Immature Granulocytes: 0 %
Lymphocytes Absolute: 1.9 10*3/uL (ref 0.7–3.1)
Lymphs: 43 %
MCH: 28 pg (ref 26.6–33.0)
MCHC: 32.7 g/dL (ref 31.5–35.7)
MCV: 85 fL (ref 79–97)
Monocytes Absolute: 0.4 10*3/uL (ref 0.1–0.9)
Monocytes: 8 %
Neutrophils Absolute: 1.9 10*3/uL (ref 1.4–7.0)
Neutrophils: 43 %
Platelets: 263 10*3/uL (ref 150–450)
RBC: 3.97 x10E6/uL (ref 3.77–5.28)
RDW: 12.6 % (ref 11.7–15.4)
WBC: 4.5 10*3/uL (ref 3.4–10.8)

## 2021-01-24 LAB — COMPREHENSIVE METABOLIC PANEL
ALT: 21 IU/L (ref 0–32)
AST: 30 IU/L (ref 0–40)
Albumin/Globulin Ratio: 2 (ref 1.2–2.2)
Albumin: 4.5 g/dL (ref 3.7–4.7)
Alkaline Phosphatase: 85 IU/L (ref 44–121)
BUN/Creatinine Ratio: 9 — ABNORMAL LOW (ref 12–28)
BUN: 12 mg/dL (ref 8–27)
Bilirubin Total: 0.3 mg/dL (ref 0.0–1.2)
CO2: 20 mmol/L (ref 20–29)
Calcium: 9.7 mg/dL (ref 8.7–10.3)
Chloride: 101 mmol/L (ref 96–106)
Creatinine, Ser: 1.34 mg/dL — ABNORMAL HIGH (ref 0.57–1.00)
Globulin, Total: 2.3 g/dL (ref 1.5–4.5)
Glucose: 111 mg/dL — ABNORMAL HIGH (ref 65–99)
Potassium: 4.5 mmol/L (ref 3.5–5.2)
Sodium: 140 mmol/L (ref 134–144)
Total Protein: 6.8 g/dL (ref 6.0–8.5)
eGFR: 41 mL/min/{1.73_m2} — ABNORMAL LOW (ref >59)

## 2021-01-24 LAB — HEMOGLOBIN A1C
Est. average glucose Bld gHb Est-mCnc: 131 mg/dL
Hgb A1c MFr Bld: 6.2 % — ABNORMAL HIGH (ref 4.8–5.6)

## 2021-01-24 LAB — LIPID PANEL
Chol/HDL Ratio: 3 ratio (ref 0.0–4.4)
Cholesterol, Total: 167 mg/dL (ref 100–199)
HDL: 55 mg/dL (ref >39)
LDL Chol Calc (NIH): 84 mg/dL (ref 0–99)
Triglycerides: 165 mg/dL — ABNORMAL HIGH (ref 0–149)
VLDL Cholesterol Cal: 28 mg/dL (ref 5–40)

## 2021-01-24 LAB — TSH: TSH: 0.797 u[IU]/mL (ref 0.450–4.500)

## 2021-01-24 LAB — CARDIOVASCULAR RISK ASSESSMENT

## 2021-02-05 ENCOUNTER — Other Ambulatory Visit: Payer: Self-pay | Admitting: Family Medicine

## 2021-02-18 ENCOUNTER — Encounter: Payer: Self-pay | Admitting: Family Medicine

## 2021-02-20 ENCOUNTER — Telehealth: Payer: Self-pay | Admitting: Family Medicine

## 2021-02-20 NOTE — Progress Notes (Signed)
  Chronic Care Management   Note  02/20/2021 Name: Persais Ethridge MRN: [id number] DOB: December 26, 1940  Coral Timme is a 80 y.o. year old female who is a primary care patient of [facility], Kirsten, MD. I reached out to [place] by phone today in response to a referral sent by Ms. Clarene Critchley [place]'s PCP, Cox, Kirsten, MD.   Ms. Giuliano was given information about Chronic Care Management services today including:  1. CCM service includes personalized support from designated clinical staff supervised by her physician, including individualized plan of care and coordination with other care providers 2. 24/7 contact phone numbers for assistance for urgent and routine care needs. 3. Service will only be billed when office clinical staff spend 20 minutes or more in a month to coordinate care. 4. Only one practitioner may furnish and bill the service in a calendar month. 5. The patient may stop CCM services at any time (effective at the end of the month) by phone call to the office staff.   Patient agreed to services and verbal consent obtained.   Follow up plan:   Carley Perdue [employer] Scheduler

## 2021-02-28 ENCOUNTER — Other Ambulatory Visit: Payer: Self-pay | Admitting: Physician Assistant

## 2021-03-09 ENCOUNTER — Telehealth: Payer: Self-pay

## 2021-03-09 NOTE — Progress Notes (Signed)
Chronic Care Management Pharmacy Assistant   Name: Katherine Richmond  MRN: [id number] DOB: Mar 26, 1941  Katherine Richmond is an 80 y.o. year old female who presents for his initial CCM visit with the clinical pharmacist.   Conditions to be addressed/monitored: HTN, HLD and CKD Stage 3, Hyperthyroid, Diabetes, ,   Recent office visits:  01/23/21-Dr. Tobie Poet PCP, Labs, Blood count normal. Liver function normal. Kidney dysfunction stable.,Diabetes improved to 6.2, Cholesterol panel at goal, diabetic foot exam done. Follow up 56mos,    Recent consult visits:  12/12/20-[facility] surgery cancer center, MRI/CAT, Leiomyosarcoma of retroperitoneum,    09/26/20, Dr. Bobby Rumpf, oncology, Anemia in chronic kidney disease, unspecified CKD stage  When evaluating her labs today, her hemoglobin remains stable at 81.  Her renal function is only minimally off.  As her hemoglobin remains above 10, she will continue to be followed conservatively.  I will see her back in 6 months for repeat clinical assessment. The patient understands all the plans discussed today and is in agreement with them  Hospital visits:  None in previous 6 months  Medications: Outpatient Encounter Medications as of 03/09/2021  Medication Sig  . aspirin 81 MG EC tablet Take by mouth.  Marland Kitchen atorvastatin (LIPITOR) 40 MG tablet TAKE ONE TABLET BY MOUTH EVERY DAY  . estradiol (ESTRACE) 1 MG tablet TAKE 1 TABLET BY MOUTH ONCE DAILY. NEEDS TO CALL FOR APPT SOON  . ferrous sulfate 325 (65 FE) MG tablet Take by mouth.  . fluticasone (FLONASE) 50 MCG/ACT nasal spray Place 2 sprays into both nostrils daily.  Marland Kitchen gabapentin (NEURONTIN) 300 MG capsule TAKE ONE CAPSULE BY MOUTH EVERY DAY AT BEDTIME  . irbesartan-hydrochlorothiazide (AVALIDE) 150-12.5 MG tablet TAKE ONE TABLET BY MOUTH DAILY  . levothyroxine (SYNTHROID) 50 MCG tablet TAKE ONE TABLET BY MOUTH DAILY  . metFORMIN (GLUCOPHAGE) 1000 MG tablet TAKE ONE TABLET BY MOUTH EVERY DAY  . metoprolol  succinate (TOPROL-XL) 100 MG 24 hr tablet Take 1 tablet (100 mg total) by mouth daily.  . Multiple Vitamins-Minerals (MULTIPLE VITAMINS/WOMENS PO) Take by mouth.  . Omega-3 Fatty Acids (OMEGA-3 FISH OIL) 1200 MG CAPS Take by mouth.  . traZODone (DESYREL) 50 MG tablet TAKE ONE TABLET BY MOUTH EVERY DAY AT BEDTIME   No facility-administered encounter medications on file as of 03/09/2021.     Lab Results  Component Value Date/Time   HGBA1C 6.2 (H) 01/23/2021 11:47 AM   HGBA1C 6.5 (H) 06/26/2020 09:46 AM   MICROALBUR 10 06/26/2020 09:50 AM   MICROALBUR 80 03/20/2020 09:02 AM     BP Readings from Last 3 Encounters:  01/23/21 132/68  09/26/20 (!) 158/72  06/26/20 124/68      . Have you seen any other providers since your last visit with PCP? No  . Any changes in your medications or health? Yes, patient stated she has had cancer in the past, she has a lung CT scheduled for 03/20/21, due to some nodules that were found.   . Any side effects from any medications? No, patient declined any side effects at this time.   . Do you have an symptoms or problems not managed by your medications? No, patient states she is doing well.   . Any concerns about your health right now? No, patient declined any concerns.   . Has your provider asked that you check blood pressure, blood sugar, or follow special diet at home? Yes, patient does not check her glucose levels, she once in a while checks her  blood pressures, she watches her diet, her husband is also a diabetic.   . Do you get any type of exercise on a regular basis? Yes, patient works 3 days a week, she also takes care of her husband who is in a wheel chair.   . Can you think of a goal you would like to reach for your health? Yes, patient said to maintain her current health.    . Do you have any problems getting your medications? No, patient declined any issues getting her medications.  o Patient's preferred pharmacy is:  [facility], [place] [address] Phone: [phone] Fax: [phone]   . Is there anything that you would like to discuss during the appointment? No, patient could not think of anything at this time .      Star Rating Drugs:  Chart does not reflect the current dates, patient confirmed dates  Medication:  Last Fill: Day Supply Atorvastatin  12/12/20  90ds Metoprolol   03/07/21  90ds Irbesartan/HCTZ  03/01/21 [place], [place] Pharmacist Assistant [phone]

## 2021-03-13 NOTE — Progress Notes (Signed)
Chronic Care Management Pharmacy Note  03/19/2021   Name:  Katherine Richmond MRN:  [id number] DOB:  05/21/1941   Plan Updates:   Patient's most recent GFR is 41. The recommendation for patient's renal dosing is metformin 500 mg bid. Please advise.   Patient reports that her legs and feet are having increase needle like painful sensation. She is taking gabapentin 300 mg daily and would like to consider increasing to 300 mg bid to try or willing to try something else to improve painful sensation. With patient's current renal function, her maximum recommended dose is 300 mg tid. Please advise.   Please consider checking updated b12 level and vitamin d with next labs.   Please consider updating patient's CKD diagnosis to stage 3b.   Subjective: Katherine Richmond is an 80 y.o. year old female who is a primary patient of Cox, Kirsten, MD.  The CCM team was consulted for assistance with disease management and care coordination needs.    Plan Updates:   Engaged with patient by telephone for initial visit in response to provider referral for pharmacy case management and/or care coordination services.   Consent to Services:  The patient was given the following information about Chronic Care Management services today, agreed to services, and gave verbal consent: 1. CCM service includes personalized support from designated clinical staff supervised by the primary care provider, including individualized plan of care and coordination with other care providers 2. 24/7 contact phone numbers for assistance for urgent and routine care needs. 3. Service will only be billed when office clinical staff spend 20 minutes or more in a month to coordinate care. 4. Only one practitioner may furnish and bill the service in a calendar month. 5.The patient may stop CCM services at any time (effective at the end of the month) by phone call to the office staff. 6. The patient will be responsible for cost sharing  (co-pay) of up to 20% of the service fee (after annual deductible is met). Patient agreed to services and consent obtained.  Patient Care Team: Rochel Brome, MD as PCP - General (Family Medicine) Marice Potter, MD as Consulting Physician (Oncology) Burnice Logan, [facility] as Pharmacist (Pharmacist)   Recent office visits:  01/23/21-Dr. Tobie Poet PCP, Labs, Blood count normal. Liver function normal. Kidney dysfunction stable.,Diabetes improved to 6.2, Cholesterol panel at goal, diabetic foot exam done. Follow up 61mo,    Recent consult visits:  12/12/20-General surgery cancer center, MRI/CAT, Leiomyosarcoma of retroperitoneum,    09/26/20, Dr. LBobby Rumpf oncology, Anemia in chronic kidney disease, unspecified CKD stage When evaluating her labs today, her hemoglobin remains stable at 162Her renal function is only minimally off.As her hemoglobin remains above 10, she will continue to be followed conservatively. I will see her back in 6 months for repeat clinical assessment. The patient understands all the plans discussed today and is in agreement with them  Hospital visits:  None in previous 6 months  Objective:  Lab Results  Component Value Date   CREATININE 1.34 (H) 01/23/2021   BUN 12 01/23/2021   GFRNONAA 35 (L) 06/26/2020   GFRAA 40 (L) 06/26/2020   NA 140 01/23/2021   K 4.5 01/23/2021   CALCIUM 9.7 01/23/2021   CO2 20 01/23/2021   GLUCOSE 111 (H) 01/23/2021    Lab Results  Component Value Date/Time   HGBA1C 6.2 (H) 01/23/2021 11:47 AM   HGBA1C 6.5 (H) 06/26/2020 09:46 AM   MICROALBUR 10 06/26/2020 09:50 AM   MICROALBUR 80  03/20/2020 09:02 AM    Last diabetic Eye exam: No results found for: HMDIABEYEEXA  Last diabetic Foot exam: No results found for: HMDIABFOOTEX   Lab Results  Component Value Date   CHOL 167 01/23/2021   HDL 55 01/23/2021   LDLCALC 84 01/23/2021   TRIG 165 (H) 01/23/2021   CHOLHDL 3.0 01/23/2021    Hepatic Function Latest Ref Rng & Units  01/23/2021 09/26/2020 06/26/2020  Total Protein 6.0 - 8.5 g/dL 6.8 - 7.0  Albumin 3.7 - 4.7 g/dL 4.5 4.3 4.6  AST 0 - 40 IU/L 30 34 25  ALT 0 - 32 IU/L 21 21 22   Alk Phosphatase 44 - 121 IU/L 85 89 96  Total Bilirubin 0.0 - 1.2 mg/dL 0.3 - 0.2    Lab Results  Component Value Date/Time   TSH 0.797 01/23/2021 11:47 AM   TSH 2.050 03/20/2020 10:11 AM    CBC Latest Ref Rng & Units 01/23/2021 09/26/2020 06/26/2020  WBC 3.4 - 10.8 x10E3/uL 4.5 4.1 4.6  Hemoglobin 11.1 - 15.9 g/dL 11.1 11.0(A) 10.9(L)  Hematocrit 34.0 - 46.6 % 33.9(L) 34(A) 32.9(L)  Platelets 150 - 450 x10E3/uL 263 218 219    No results found for: VD25OH  Clinical ASCVD: No  The 10-year ASCVD risk score Mikey Bussing DC Jr., et al., 2013) is: 32.1%   Values used to calculate the score:     Age: 80 years     Sex: Female     Is Non-Hispanic African American: Yes     Diabetic: Yes     Tobacco smoker: No     Systolic Blood Pressure: 333 mmHg     Is BP treated: Yes     HDL Cholesterol: 55 mg/dL     Total Cholesterol: 167 mg/dL    Depression screen [facility] 2/9 02/18/2021 12/21/2019  Decreased Interest 0 0  Down, Depressed, Hopeless 0 0  PHQ - 2 Score 0 0     Social History   Tobacco Use  Smoking Status Former Smoker  . Types: Cigarettes  . Quit date: 2012  . Years since quitting: 10.3  Smokeless Tobacco Never Used   BP Readings from Last 3 Encounters:  01/23/21 132/68  09/26/20 (!) 158/72  06/26/20 124/68   Pulse Readings from Last 3 Encounters:  01/23/21 64  09/26/20 (!) 56  06/26/20 60   Wt Readings from Last 3 Encounters:  01/23/21 177 lb (80.3 kg)  09/26/20 182 lb 14.4 oz (83 kg)  06/26/20 182 lb (82.6 kg)   BMI Readings from Last 3 Encounters:  01/23/21 28.57 kg/m  09/26/20 29.52 kg/m  06/26/20 29.38 kg/m    Assessment/Interventions: Review of patient past medical history, allergies, medications, health status, including review of consultants reports, laboratory and other test data, was performed as  part of comprehensive evaluation and provision of chronic care management services.   SDOH:  (Social Determinants of Health) assessments and interventions performed: Yes  SDOH Screenings   Alcohol Screen: Not on file  Depression (PHQ2-9): Low Risk   . PHQ-2 Score: 0  Financial Resource Strain: Not on file  Food Insecurity: No Food Insecurity  . Worried About Charity fundraiser in the Last Year: Never true  . Ran Out of Food in the Last Year: Never true  Housing: Low Risk   . Last Housing Risk Score: 0  Physical Activity: Insufficiently Active  . Days of Exercise per Week: 2 days  . Minutes of Exercise per Session: 30 min  Social Connections: Not  on file  Stress: Not on file  Tobacco Use: Medium Risk  . Smoking Tobacco Use: Former Smoker  . Smokeless Tobacco Use: Never Used  Transportation Needs: No Transportation Needs  . Lack of Transportation (Medical): No  . Lack of Transportation (Non-Medical): No    CCM Care Plan  Allergies  Allergen Reactions  . Contrast Media [Iodinated Diagnostic Agents] Nausea And Vomiting  . Welchol [Colesevelam Hcl] Other (See Comments)    CONSTIPATION  . Ace Inhibitors Cough    Medications Reviewed Today    Reviewed by Burnice Logan, [facility] (Pharmacist) on 03/19/21 at 1321  Med List Status: <None>  Medication Order Taking? Sig Documenting Provider Last Dose Status Informant  aspirin 81 MG EC tablet [id number] Yes Take 81 mg by mouth daily. [provider] Taking Active   atorvastatin (LIPITOR) 40 MG tablet [id number] Yes TAKE ONE TABLET BY MOUTH EVERY DAY Rip Harbour, NP Taking Active   estradiol (ESTRACE) 1 MG tablet [id number] Yes TAKE 1 TABLET BY MOUTH ONCE DAILY. NEEDS TO CALL FOR APPT SOON [provider] Taking Active   ferrous sulfate 325 (65 FE) MG tablet [id number] Yes Take by mouth. [provider] Taking Active   fluticasone (FLONASE) 50 MCG/ACT nasal spray [id number] Yes Place 2 sprays into both nostrils  daily. Cox, Kirsten, MD Taking Active   gabapentin (NEURONTIN) 300 MG capsule [id number] Yes TAKE ONE CAPSULE BY MOUTH EVERY DAY AT BEDTIME Cox, Kirsten, MD Taking Active   irbesartan-hydrochlorothiazide (AVALIDE) 150-12.5 MG tablet [id number] Yes TAKE ONE TABLET BY MOUTH DAILY Marge Duncans, PA-C Taking Active   levothyroxine (SYNTHROID) 50 MCG tablet [id number] Yes TAKE ONE TABLET BY MOUTH DAILY Cox, Kirsten, MD Taking Active   metFORMIN (GLUCOPHAGE) 1000 MG tablet [id number] Yes TAKE ONE TABLET BY MOUTH EVERY DAY Rip Harbour, NP Taking Active   metoprolol succinate (TOPROL-XL) 100 MG 24 hr tablet [id number] Yes Take 1 tablet (100 mg total) by mouth daily. Marge Duncans, PA-C Taking Active   Multiple Vitamins-Minerals (MULTIPLE VITAMINS/WOMENS PO) [id number] Yes Take by mouth. [provider] Taking Active   Omega-3 Fatty Acids (OMEGA-3 FISH OIL) 1200 MG CAPS [id number] Yes Take 1 capsule by mouth in the morning and at bedtime. [provider] Taking Active   traZODone (DESYREL) 50 MG tablet [id number] Yes TAKE ONE TABLET BY MOUTH EVERY DAY AT BEDTIME Rip Harbour, NP Taking Active           Patient Active Problem List   Diagnosis Date Noted  . Overweight with body mass index (BMI) of 28 to 28.9 in adult 03/20/2020  . Stage 3a chronic kidney disease ([place]) 03/20/2020  . Thyroid disease   . Hypertension   . Hyperlipidemia   . Diabetes mellitus without complication ([place])   . Liver metastases ([place]) 11/09/2019  . 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 Fluad Quad(high Dose 65+) 08/03/2020  . Influenza-Unspecified 06/16/2019  . Moderna Sars-Covid-2 Vaccination 12/31/2019, 01/31/2020, 08/31/2020  . Pneumococcal Conjugate-13 08/03/2015  . Pneumococcal Polysaccharide-23 11/08/2013  . Tdap 01/05/2013  . Zoster 09/10/2016    Conditions to be addressed/monitored:  Hypertension, Hyperlipidemia,  Diabetes, Chronic Kidney Disease and Hypothyroidism  Care Plan : [place]  Updates made by Burnice Logan, [place] since 03/19/2021 12:00 AM    Problem: htn, hld, dm, ckd, hypothyroidism   Priority: High  Onset Date: 03/19/2021    Long-Range Goal: Disease State Management   Start Date:  03/19/2021  Expected End Date: 03/19/2022  This Visit's Progress: On track  Priority: High  Note:    Current Barriers:  . 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   Interventions: . 1:1 collaboration with Rochel Brome, MD regarding development and update of comprehensive plan of care as evidenced by provider attestation and co-signature . Inter-disciplinary care team collaboration (see longitudinal plan of care) . Comprehensive medication review performed; medication list updated in electronic medical record  Hypertension (BP goal <130/80) -Controlled -Current treatment: . metoprolol succinate 100 mg daily  . Irbesartan-hydrochlorothiazide 150-12.5 mg daily  -Medications previously tried: none reported  -Current home readings: 123/75 mmHg -Current dietary habits: eats healthy, limits salt and carbohydrates -Current exercise habits: walks dogs around trail at the [facility] 2 times weekly  -Denies hypotensive/hypertensive symptoms -Educated on BP goals and benefits of medications for prevention of heart attack, stroke and kidney damage; Daily salt intake goal < 2300 mg; Exercise goal of 150 minutes per week; -Counseled to monitor BP at home as needed, document, and provide log at future appointments -Counseled on diet and exercise extensively Recommended to continue current medication  Hyperlipidemia: (LDL goal < 70) -Not ideally controlled -Current treatment: . aspirin 81 mg ec daily . Atorvastatin 40 mg daily  . Fish Oil 1200 mg  bid -Medications previously tried: none reported  -Current dietary patterns: eats healthy, likes salads, limits fried and fatty foods -Current exercise habits: active as a caregiver. Limits fried and fatty foods.  -Educated on Cholesterol goals;  Benefits of statin for ASCVD risk reduction; Importance of limiting foods high in cholesterol; Exercise goal of 150 minutes per week; -Counseled on diet and exercise extensively Recommended to continue current medication  Diabetes (A1c goal <7%) -Controlled -Current medications: . metformin 1000 mg daily  -Medications previously tried: none reported  -Current home glucose readings . fasting glucose: not checking  . post prandial glucose: not checking  -Denies hypoglycemic/hyperglycemic symptoms -Current meal patterns:  . breakfast: oatmeal, breakfast burrito  . lunch: salad with chicken   . Dinner: lean meat, starch and vegetables . snacks: fruit . drinks: water with sugar free flavoring, decaf coffee -Current exercise: walks 2 days a week and active as a caregiver  -Educated on A1c and blood sugar goals; Complications of diabetes including kidney damage, retinal damage, and cardiovascular disease; Exercise goal of 150 minutes per week; Carbohydrate counting and/or plate method -Counseled to check feet daily and get yearly eye exams -Counseled on diet and exercise extensively Recommended considering changing metformin 500 mg bid due to recent GFR of 41.   Thyroid Disease (Goal: mange thyroid disease) -Controlled -Current treatment  . Levothyroxine 50 mcg daily  -Medications previously tried: none reported  -Recommended to continue current medication  Osteoporosis / Osteopenia (Goal prevent fractures) -Controlled -Last DEXA Scan: not available in chart -Patient is not a candidate for pharmacologic treatment -Current treatment  . Diet and exercise -Medications previously tried: none reported  -Recommend [phone] units of  vitamin D daily. Recommend 1200 mg of calcium daily from dietary and supplemental sources. Recommend weight-bearing and muscle strengthening exercises for building and maintaining bone density. -Counseled on diet and exercise extensively  Nerve Pain  (Goal: reduce nerve pain symptoms) -Not ideally controlled -Current treatment  . gabapentin 300 mg daily at bedtime  -Medications previously tried: none reported  -Recommended considering increase to 300 mg  bid due to worsening neuropathy symptoms in feet. Consider checking b-12 level to rule out sources of worsening tingling sensation in legs/feet.   Sleep (Goal: improve sleep) -Controlled -Current treatment  . Trazodone 50 mg daily at bedtime -Medications previously tried: none reported  -Recommended to continue current medication Assessed sleep patterns.     Health Maintenance -Vaccine gaps: recommend fourth COVID shot  -Current therapy:  . estradiol 1 mg daily  . Ferrous sulfate 325 mg daily  . Multiple vitamin daily  -Educated on Supplements may interfere with prescription drugs -Patient is satisfied with current therapy and denies issues -Counseled on diet and exercise extensively Recommended to continue current medication   Patient Goals/Self-Care Activities . Patient will:  - take medications as prescribed focus on medication adherence by continuing to use pill box engage in dietary modifications by limiting carbohdyrates and focusing on lean protein, fruit and vegetables.   Follow Up Plan: Telephone follow up appointment with care management team member scheduled for: 03/2022      Medication Assistance: None required.  Patient affirms current coverage meets needs.  Patient's preferred pharmacy is:  [facility], [place] [address] Phone: [phone] Fax: [phone]  Uses pill box? Yes Pt endorses good compliance - states she misses morning medication dose 1-2  times each month  We discussed: Benefits of medication synchronization, packaging and delivery as well as enhanced pharmacist oversight with [employer]. Patient decided to: Continue current medication management strategy  Care Plan and Follow Up Patient Decision:  Patient agrees to Care Plan and Follow-up.  Plan: Telephone follow up appointment with care management team member scheduled for:  03/2022

## 2021-03-19 ENCOUNTER — Other Ambulatory Visit: Payer: Self-pay

## 2021-03-19 ENCOUNTER — Ambulatory Visit (INDEPENDENT_AMBULATORY_CARE_PROVIDER_SITE_OTHER): Payer: Medicare PPO

## 2021-03-19 DIAGNOSIS — I129 Hypertensive chronic kidney disease with stage 1 through stage 4 chronic kidney disease, or unspecified chronic kidney disease: Secondary | ICD-10-CM

## 2021-03-19 DIAGNOSIS — E1169 Type 2 diabetes mellitus with other specified complication: Secondary | ICD-10-CM

## 2021-03-19 DIAGNOSIS — N1831 Chronic kidney disease, stage 3a: Secondary | ICD-10-CM | POA: Diagnosis not present

## 2021-03-19 DIAGNOSIS — E1121 Type 2 diabetes mellitus with diabetic nephropathy: Secondary | ICD-10-CM

## 2021-03-19 DIAGNOSIS — M791 Myalgia, unspecified site: Secondary | ICD-10-CM

## 2021-03-19 DIAGNOSIS — E782 Mixed hyperlipidemia: Secondary | ICD-10-CM | POA: Diagnosis not present

## 2021-03-19 NOTE — Patient Instructions (Addendum)
Visit Information  Thank you for your time discussing your medications. I look forward to working with you to achieve your health care goals. Below is a summary of what we talked about during our visit.   Goals Addressed            This Visit's Progress   . Learn More About My Health       Timeframe:  Long-Range Goal Priority:  High Start Date:                             Expected End Date:                        Follow Up Date 03/2022   - tell my story and reason for my visit - make a list of questions - ask questions - repeat what I heard to make sure I understand - bring a list of my medicines to the visit - speak up when I don't understand    Why is this important?    The best way to learn about your health and care is by talking to the doctor and nurse.   They will answer your questions and give you information in the way that you like best.    Notes:     Marland Kitchen Manage My Medicine       Timeframe:  Long-Range Goal Priority:  High Start Date:                             Expected End Date:                       Follow Up Date 03/2022    - call for medicine refill 2 or 3 days before it runs out - keep a list of all the medicines I take; vitamins and herbals too - use a pillbox to sort medicine - use an alarm clock or phone to remind me to take my medicine    Why is this important?   . These steps will help you keep on track with your medicines.   Notes:     . Set My Target A1C-Diabetes Type 2       Timeframe:  Long-Range Goal Priority:  High Start Date:                             Expected End Date:                       Follow Up Date 03/2022    - set target A1C    Why is this important?    Your target A1C is decided together by you and your doctor.   It is based on several things like your age and other health issues.    Notes:        Patient Care Plan: CCM Pharmacy Care Plan    Problem Identified: htn, hld, dm, ckd, hypothyroidism    Priority: High  Onset Date: 03/19/2021    Long-Range Goal: Disease State Management   Start Date: 03/19/2021  Expected End Date: 03/19/2022  This Visit's Progress: On track  Priority: High  Note:    Current Barriers:  . 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   Interventions: . 1:1 collaboration with Rochel Brome, MD regarding development and update of comprehensive plan of care as evidenced by provider attestation and co-signature . Inter-disciplinary care team collaboration (see longitudinal plan of care) . Comprehensive medication review performed; medication list updated in electronic medical record  Hypertension (BP goal <130/80) -Controlled -Current treatment: . metoprolol succinate 100 mg daily  . Irbesartan-hydrochlorothiazide 150-12.5 mg daily  -Medications previously tried: none reported  -Current home readings: 123/75 mmHg -Current dietary habits: eats healthy, limits salt and carbohydrates -Current exercise habits: walks dogs around trail at the [facility] 2 times weekly  -Denies hypotensive/hypertensive symptoms -Educated on BP goals and benefits of medications for prevention of heart attack, stroke and kidney damage; Daily salt intake goal < 2300 mg; Exercise goal of 150 minutes per week; -Counseled to monitor BP at home as needed, document, and provide log at future appointments -Counseled on diet and exercise extensively Recommended to continue current medication  Hyperlipidemia: (LDL goal < 70) -Not ideally controlled -Current treatment: . aspirin 81 mg ec daily . Atorvastatin 40 mg daily  . Fish Oil 1200 mg bid -Medications previously tried: none reported  -Current dietary patterns: eats healthy, likes salads, limits fried and fatty foods -Current exercise habits: active as a caregiver. Limits fried and  fatty foods.  -Educated on Cholesterol goals;  Benefits of statin for ASCVD risk reduction; Importance of limiting foods high in cholesterol; Exercise goal of 150 minutes per week; -Counseled on diet and exercise extensively Recommended to continue current medication  Diabetes (A1c goal <7%) -Controlled -Current medications: . metformin 1000 mg daily  -Medications previously tried: none reported  -Current home glucose readings . fasting glucose: not checking  . post prandial glucose: not checking  -Denies hypoglycemic/hyperglycemic symptoms -Current meal patterns:  . breakfast: oatmeal, breakfast burrito  . lunch: salad with chicken   . Dinner: lean meat, starch and vegetables . snacks: fruit . drinks: water with sugar free flavoring, decaf coffee -Current exercise: walks 2 days a week and active as a caregiver  -Educated on A1c and blood sugar goals; Complications of diabetes including kidney damage, retinal damage, and cardiovascular disease; Exercise goal of 150 minutes per week; Carbohydrate counting and/or plate method -Counseled to check feet daily and get yearly eye exams -Counseled on diet and exercise extensively Recommended considering changing metformin 500 mg bid due to recent GFR of 41.   Thyroid Disease (Goal: mange thyroid disease) -Controlled -Current treatment  . Levothyroxine 50 mcg daily  -Medications previously tried: none reported  -Recommended to continue current medication  Osteoporosis / Osteopenia (Goal prevent fractures) -Controlled -Last DEXA Scan: not available in chart -Patient is not a candidate for pharmacologic treatment -Current treatment  . Diet and exercise -Medications previously tried: none reported  -Recommend [phone] units of vitamin D daily. Recommend 1200 mg of calcium daily from dietary and supplemental sources. Recommend weight-bearing and muscle strengthening exercises for building and maintaining bone density. -Counseled on  diet and exercise extensively  Nerve Pain  (Goal: reduce nerve pain symptoms) -Not ideally controlled -Current treatment  . gabapentin 300 mg daily at bedtime  -Medications previously tried: none reported  -Recommended considering increase to 300 mg bid due to worsening neuropathy symptoms in feet. Consider checking b-12 level to rule out sources of worsening tingling sensation in legs/feet.   Sleep (Goal: improve sleep) -Controlled -Current treatment  . Trazodone 50 mg daily at  bedtime -Medications previously tried: none reported  -Recommended to continue current medication Assessed sleep patterns.     Health Maintenance -Vaccine gaps: recommend fourth COVID shot  -Current therapy:  . estradiol 1 mg daily  . Ferrous sulfate 325 mg daily  . Multiple vitamin daily  -Educated on Supplements may interfere with prescription drugs -Patient is satisfied with current therapy and denies issues -Counseled on diet and exercise extensively Recommended to continue current medication   Patient Goals/Self-Care Activities . Patient will:  - take medications as prescribed focus on medication adherence by continuing to use pill box engage in dietary modifications by limiting carbohdyrates and focusing on lean protein, fruit and vegetables.   Follow Up Plan: Telephone follow up appointment with care management team member scheduled for: 03/2022      Katherine Richmond was given information about Chronic Care Management services today including:  1. CCM service includes personalized support from designated clinical staff supervised by her physician, including individualized plan of care and coordination with other care providers 2. 24/7 contact phone numbers for assistance for urgent and routine care needs. 3. Standard insurance, coinsurance, copays and deductibles apply for chronic care management only during months in which we provide at least 20 minutes of these services. Most insurances cover  these services at 100%, however patients may be responsible for any copay, coinsurance and/or deductible if applicable. This service may help you avoid the need for more expensive face-to-face services. 4. Only one practitioner may furnish and bill the service in a calendar month. 5. The patient may stop CCM services at any time (effective at the end of the month) by phone call to the office staff.  Patient agreed to services and verbal consent obtained.   Patient verbalizes understanding of instructions provided today and agrees to view in MyChart.  Telephone follow up appointment with pharmacy team member scheduled for: 03/2022 with pharmacist  Juliane LackSara Beth Kamarii Buren, PharmD Clinical Pharmacist [facility] [phone] (office) [phone] (mobile)  Diabetic Neuropathy Diabetic neuropathy refers to nerve damage that is caused by diabetes. Over time, people with diabetes can develop nerve damage throughout the body. There are several types of diabetic neuropathy:  Peripheral neuropathy. This is the most common type of diabetic neuropathy. It damages the nerves that carry signals between the spinal cord and other parts of the body (peripheral nerves). This usually affects nerves in the feet, legs, hands, and arms.  Autonomic neuropathy. This type causes damage to nerves that control involuntary functions (autonomic nerves). Involuntary functions are functions of the body that you do not control. They include heartbeat, body temperature, blood pressure, urination, digestion, sweating, sexual function, or response to changes in blood glucose.  Focal neuropathy. This type of nerve damage affects one area of the body, such as an arm, a leg, or the face. The injury may involve one nerve or a small group of nerves. Focal neuropathy can be painful and unpredictable. It occurs most often in older adults with diabetes. This often develops suddenly, but usually improves over time and does not cause  long-term problems.  Proximal neuropathy. This type of nerve damage affects the nerves of the thighs, hips, buttocks, or legs. It causes severe pain, weakness, and muscle death (atrophy), usually in the thigh muscles. It is more common among older men and people who have type 2 diabetes. The length of recovery time may vary. What are the causes? Peripheral, autonomic, and focal neuropathies are caused by diabetes that is not well controlled with treatment.  The cause of proximal neuropathy is not known, but it may be caused by inflammation related to uncontrolled blood glucose levels. What are the signs or symptoms? Peripheral neuropathy Peripheral neuropathy develops slowly over time. When the nerves of the feet and legs no longer work, you may experience:  Burning, stabbing, or aching pain in the legs or feet.  Pain or cramping in the legs or feet.  Loss of feeling (numbness) and inability to feel pressure or pain in the feet. This can lead to: ? Thick calluses or sores on areas of constant pressure. ? Ulcers. ? Reduced ability to feel temperature changes.  Foot deformities.  Muscle weakness.  Loss of balance or coordination. Autonomic neuropathy The symptoms of autonomic neuropathy vary depending on which nerves are affected. Symptoms may include:  Problems with digestion, such as: ? Nausea or vomiting. ? Poor appetite. ? Bloating. ? Diarrhea or constipation. ? Trouble swallowing. ? Losing weight without trying to.  Problems with the heart, blood, and lungs, such as: ? Dizziness, especially when standing up. ? Fainting. ? Shortness of breath. ? Irregular heartbeat.  Bladder problems, such as: ? Trouble starting or stopping urination. ? Leaking urine. ? Trouble emptying the bladder. ? Urinary tract infections (UTIs).  Problems with other body functions, such as: ? Sweat. You may sweat too much or too little. ? Temperature. You might get hot easily. Or, you might feel  cold more than usual. ? Sexual function. Men may not be able to get or maintain an erection. Women may have vaginal dryness and difficulty with arousal. Focal neuropathy Symptoms affect only one area of the body. Common symptoms include:  Numbness.  Tingling.  Burning pain.  Prickling feeling.  Very sensitive skin.  Weakness.  Inability to move (paralysis).  Muscle twitching.  Muscles getting smaller (wasting).  Poor coordination.  Double or blurred vision. Proximal neuropathy  Sudden, severe pain in the hip, thigh, or buttocks. Pain may spread from the back into the legs (sciatica).  Pain and numbness in the arms and legs.  Tingling.  Loss of bladder control or bowel control.  Weakness and wasting of thigh muscles.  Difficulty getting up from a seated position.  Abdominal swelling.  Unexplained weight loss. How is this diagnosed? Diagnosis varies depending on the type of neuropathy your health care provider suspects. Peripheral neuropathy Your health care provider will do a neurologic exam. This exam checks your reflexes, how you move, and what you can feel. You may have other tests, such as:  Blood tests.  Tests of the fluid that surrounds the spinal cord (lumbar puncture).  CT scan.  MRI.  Checking the nerves that control muscles (electromyogram, or EMG).  Checking how quickly signals pass through your nerves (nerve conduction study).  Checking a small piece of a nerve using a microscope (biopsy). Autonomic neuropathy You may have tests, such as:  Tests to measure your blood pressure and heart rate. You may be secured to an exam table that moves you from a lying position to an upright position (table tilt test).  Breathing tests to check your lungs.  Tests to check how food moves through the digestive system (gastric emptying tests).  Blood, sweat, or urine tests.  Ultrasound of your bladder.  Spinal fluid tests. Focal neuropathy This  condition may be diagnosed with:  A neurologic exam.  CT scan.  MRI.  EMG.  Nerve conduction study. Proximal neuropathy There is no test to diagnose this type of neuropathy. You may have  tests to rule out other possible causes of this type of neuropathy. Tests may include:  X-rays of your spine and lumbar region.  Lumbar puncture.  MRI. How is this treated? The goal of treatment is to keep nerve damage from getting worse. Treatment may include:  Following your diabetes management plan. This will help keep your blood glucose level and your A1C level within your target range. This is the most important treatment.  Using prescription pain medicine. Follow these instructions at home: Diabetes management Follow your diabetes management plan as told by your health care provider.  Check your blood glucose levels.  Keep your blood glucose in your target range.  Have your A1C level checked at least two times a year, or as often as told.  Take over the counter and prescription medicines only as told by your health care provider. This includes insulin and diabetes medicine.   Lifestyle  Do not use any products that contain nicotine or tobacco, such as cigarettes, e-cigarettes, and chewing tobacco. If you need help quitting, ask your health care provider.  Be physically active every day. Include strength training and balance exercises.  Follow a healthy meal plan.  Work with your health care provider to manage your blood pressure.   General instructions  Ask your health care provider if the medicine prescribed to you requires you to avoid driving or using machinery.  Check your skin and feet every day for cuts, bruises, redness, blisters, or sores.  Keep all follow-up visits. This is important. Contact a health care provider if:  You have burning, stabbing, or aching pain in your legs or feet.  You are unable to feel pressure or pain in your feet.  You develop problems  with digestion, such as: ? Nausea. ? Vomiting. ? Bloating. ? Constipation. ? Diarrhea. ? Abdominal pain.  You have difficulty with urination, such as: ? Inability to control when you urinate (incontinence). ? Inability to completely empty the bladder (retention).  You feel as if your heart is racing (palpitations).  You feel dizzy, weak, or faint when you stand up. Get help right away if:  You cannot urinate.  You have sudden weakness or loss of coordination.  You have trouble speaking.  You have pain or pressure in your chest.  You have an irregular heartbeat.  You have sudden inability to move a part of your body. These symptoms may represent a serious problem that is an emergency. Do not wait to see if the symptoms will go away. Get medical help right away. Call your local emergency services (911 in the [place] Do not drive yourself to the hospital. Summary  Diabetic neuropathy is nerve damage that is caused by diabetes. It can cause numbness and pain in the arms, legs, digestive tract, heart, and other body systems.  This condition is treated by keeping your blood glucose level and your A1C level within your target range. This can help prevent neuropathy from getting worse.  Check your skin and feet every day for cuts, bruises, redness, blisters, or sores.  Do not use any products that contain nicotine or tobacco, such as cigarettes, e-cigarettes, and chewing tobacco. This information is not intended to replace advice given to you by your health care provider. Make sure you discuss any questions you have with your health care provider. Document Revised: 03/02/2020 Document Reviewed: 03/02/2020 Elsevier Patient Education  [employer].

## 2021-03-20 ENCOUNTER — Telehealth: Payer: Self-pay

## 2021-03-20 DIAGNOSIS — R918 Other nonspecific abnormal finding of lung field: Secondary | ICD-10-CM | POA: Diagnosis not present

## 2021-03-20 DIAGNOSIS — Z08 Encounter for follow-up examination after completed treatment for malignant neoplasm: Secondary | ICD-10-CM | POA: Diagnosis not present

## 2021-03-20 DIAGNOSIS — C787 Secondary malignant neoplasm of liver and intrahepatic bile duct: Secondary | ICD-10-CM | POA: Diagnosis not present

## 2021-03-20 DIAGNOSIS — Z8509 Personal history of malignant neoplasm of other digestive organs: Secondary | ICD-10-CM | POA: Diagnosis not present

## 2021-03-20 DIAGNOSIS — C480 Malignant neoplasm of retroperitoneum: Secondary | ICD-10-CM | POA: Diagnosis not present

## 2021-03-20 NOTE — Progress Notes (Addendum)
    Chronic Care Management Pharmacy Assistant   Name: Zamyia Gowell  MRN: [id number] DOB: 02/11/1941   Reason for Encounter: Medication Review for Metformin and Gabapentin    Medications: Outpatient Encounter Medications as of 03/20/2021  Medication Sig   aspirin 81 MG EC tablet Take 81 mg by mouth daily.   atorvastatin (LIPITOR) 40 MG tablet TAKE ONE TABLET BY MOUTH EVERY DAY   estradiol (ESTRACE) 1 MG tablet TAKE 1 TABLET BY MOUTH ONCE DAILY. NEEDS TO CALL FOR APPT SOON   ferrous sulfate 325 (65 FE) MG tablet Take by mouth.   fluticasone (FLONASE) 50 MCG/ACT nasal spray Place 2 sprays into both nostrils daily.   gabapentin (NEURONTIN) 300 MG capsule TAKE ONE CAPSULE BY MOUTH EVERY DAY AT BEDTIME   irbesartan-hydrochlorothiazide (AVALIDE) 150-12.5 MG tablet TAKE ONE TABLET BY MOUTH DAILY   levothyroxine (SYNTHROID) 50 MCG tablet TAKE ONE TABLET BY MOUTH DAILY   metFORMIN (GLUCOPHAGE) 1000 MG tablet TAKE ONE TABLET BY MOUTH EVERY DAY   metoprolol succinate (TOPROL-XL) 100 MG 24 hr tablet Take 1 tablet (100 mg total) by mouth daily.   Multiple Vitamins-Minerals (MULTIPLE VITAMINS/WOMENS PO) Take by mouth.   Omega-3 Fatty Acids (OMEGA-3 FISH OIL) 1200 MG CAPS Take 1 capsule by mouth in the morning and at bedtime.   traZODone (DESYREL) 50 MG tablet TAKE ONE TABLET BY MOUTH EVERY DAY AT BEDTIME   No facility-administered encounter medications on file as of 03/20/2021.    Donette Larry, CPP asked me to call patient and let her know to take 1/2 tablet of Metformin twice a day.  Gabapentin 300mg  2x a day for a week and can consider increase to three times daily if pain not improved.Marland Kitchen  Spoke to patient , let her know about her changes.    Also let her know about her husband cutting is Rosuvastatin to 1/2 tablet daily.  Clarita Leber, [place] Pharmacist Assistant [phone]

## 2021-03-22 NOTE — Progress Notes (Signed)
[facility]  [address] [place],  [place]  [address] [phone]  Clinic Day:  03/26/2021  Referring physician: Rochel Brome, MD  This document serves as a record of services personally performed by Marice Potter, MD. It was created on their behalf by [facility], a trained medical scribe. The creation of this record is based on the scribe's personal observations and the provider's statements to them.  HISTORY OF PRESENT ILLNESS:  The patient is a 80 y.o. 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 She comes in today for routine follow-up.  Since her last visit, the patient has been doing well. She denies having increased fatigue or any overt forms of blood loss which concern her for progressive anemia.    PHYSICAL EXAM:  Blood pressure (!) 190/80, pulse 60, temperature 98.1 F (36.7 C), resp. rate 16, height 5\' 6"  (1.676 m), weight 179 lb 8 oz (81.4 kg), SpO2 97 %. Wt Readings from Last 3 Encounters:  03/26/21 179 lb 8 oz (81.4 kg)  01/23/21 177 lb (80.3 kg)  09/26/20 182 lb 14.4 oz (83 kg)   Body mass index is 28.97 kg/m. Performance status (ECOG): 0 - Asymptomatic Physical Exam Constitutional:      Appearance: Normal appearance. She is not ill-appearing.  HENT:     Mouth/Throat:     Mouth: Mucous membranes are moist.     Pharynx: Oropharynx is clear. No oropharyngeal exudate or posterior oropharyngeal erythema.  Cardiovascular:     Rate and Rhythm: Normal rate and regular rhythm.     Heart sounds: No murmur heard. No friction rub. No gallop.   Pulmonary:     Effort: Pulmonary effort is normal. No respiratory distress.     Breath sounds: Normal breath sounds.  No wheezing, rhonchi or rales.  Chest:  Breasts:     Right: No axillary adenopathy or supraclavicular adenopathy.     Left: No axillary adenopathy or supraclavicular adenopathy.    Abdominal:     General: Bowel sounds are normal. There is no distension.     Palpations: Abdomen is soft. There is no mass.     Tenderness: There is no abdominal tenderness.  Musculoskeletal:        General: No swelling.     Right lower leg: No edema.     Left lower leg: No edema.  Lymphadenopathy:     Cervical: No cervical adenopathy.     Upper Body:     Right upper body: No supraclavicular or axillary adenopathy.     Left upper body: No supraclavicular or axillary adenopathy.     Lower Body: No right inguinal adenopathy. No left inguinal adenopathy.  Skin:    General: Skin is warm.     Coloration: Skin is not jaundiced.     Findings: No lesion or rash.  Neurological:     General: No focal deficit present.     Mental Status: She is alert and oriented to person, place, and time. Mental status is at baseline.     Cranial Nerves: Cranial nerves are intact.  Psychiatric:        Mood and Affect: Mood normal.        Behavior: Behavior normal.  Thought Content: Thought content normal.     LABS:      Ref. Range 03/26/2021 00:00 03/26/2021 09:58  Sodium Latest Ref Range: 137 - 147  136 (A)   Potassium Latest Ref Range: 3.4 - 5.3  4.1   Chloride Latest Ref Range: 99 - 108  102   CO2 Latest Ref Range: 13 - 22  28 (A)   Glucose Unknown 108   BUN Latest Ref Range: 4 - 21  15   Creatinine Latest Ref Range: 0.5 - 1.1  1.2 (A)   Calcium Latest Ref Range: 8.7 - 10.7  9.3   Alkaline Phosphatase Latest Ref Range: 25 - 125  93   Albumin Latest Ref Range: 3.5 - 5.0  4.3   AST Latest Ref Range: 13 - 35  30   ALT Latest Ref Range: 7 - 35  19   Bilirubin, Total Unknown 0.3   Iron Latest Ref Range: 28 - 170 ug/dL  47  UIBC Latest Units: ug/dL  280  TIBC Latest Ref Range: 250 - 450 ug/dL  327   Saturation Ratios Latest Ref Range: 10.4 - 31.8 %  14  Ferritin Latest Ref Range: 11 - 307 ng/mL  62    ASSESSMENT & PLAN:  Assessment/Plan:  A 80 y.o. female with a history of mild anemia secondary to iron deficiency and mild renal insufficiency.  When evaluating her labs today, I am pleased as her hemoglobin hovers near 11.  Her iron parameters also remain normal.  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  As her hemoglobin remains above 10, she will continue to be followed conservatively.  I will see her back in 6 months for repeat clinical assessment.  The patient understands all the plans discussed today and is in agreement with them.     I, Rita Ohara, am acting as scribe for Marice Potter, MD    I have reviewed this report as typed by the medical scribe, and it is complete and accurate.  Dequincy Macarthur Critchley, MD

## 2021-03-25 ENCOUNTER — Other Ambulatory Visit: Payer: Self-pay | Admitting: Oncology

## 2021-03-25 DIAGNOSIS — D631 Anemia in chronic kidney disease: Secondary | ICD-10-CM

## 2021-03-25 DIAGNOSIS — N189 Chronic kidney disease, unspecified: Secondary | ICD-10-CM

## 2021-03-26 ENCOUNTER — Encounter: Payer: Self-pay | Admitting: Oncology

## 2021-03-26 ENCOUNTER — Other Ambulatory Visit: Payer: Self-pay

## 2021-03-26 ENCOUNTER — Other Ambulatory Visit: Payer: Self-pay | Admitting: Oncology

## 2021-03-26 ENCOUNTER — Inpatient Hospital Stay: Payer: Medicare PPO | Attending: Oncology

## 2021-03-26 ENCOUNTER — Inpatient Hospital Stay: Payer: Medicare PPO | Admitting: Oncology

## 2021-03-26 ENCOUNTER — Telehealth: Payer: Self-pay | Admitting: Oncology

## 2021-03-26 DIAGNOSIS — N189 Chronic kidney disease, unspecified: Secondary | ICD-10-CM

## 2021-03-26 DIAGNOSIS — N1831 Chronic kidney disease, stage 3a: Secondary | ICD-10-CM | POA: Diagnosis not present

## 2021-03-26 DIAGNOSIS — D631 Anemia in chronic kidney disease: Secondary | ICD-10-CM

## 2021-03-26 DIAGNOSIS — Z79899 Other long term (current) drug therapy: Secondary | ICD-10-CM | POA: Insufficient documentation

## 2021-03-26 DIAGNOSIS — D630 Anemia in neoplastic disease: Secondary | ICD-10-CM | POA: Diagnosis not present

## 2021-03-26 DIAGNOSIS — I129 Hypertensive chronic kidney disease with stage 1 through stage 4 chronic kidney disease, or unspecified chronic kidney disease: Secondary | ICD-10-CM | POA: Diagnosis not present

## 2021-03-26 DIAGNOSIS — D649 Anemia, unspecified: Secondary | ICD-10-CM | POA: Diagnosis not present

## 2021-03-26 DIAGNOSIS — C480 Malignant neoplasm of retroperitoneum: Secondary | ICD-10-CM | POA: Diagnosis not present

## 2021-03-26 LAB — CBC AND DIFFERENTIAL
HCT: 34 — AB (ref 36–46)
Hemoglobin: 10.9 — AB (ref 12.0–16.0)
Neutrophils Absolute: 1.85
Platelets: 225 (ref 150–399)
WBC: 4.5

## 2021-03-26 LAB — BASIC METABOLIC PANEL
BUN: 15 (ref 4–21)
CO2: 28 — AB (ref 13–22)
Chloride: 102 (ref 99–108)
Creatinine: 1.2 — AB (ref 0.5–1.1)
Glucose: 108
Potassium: 4.1 (ref 3.4–5.3)
Sodium: 136 — AB (ref 137–147)

## 2021-03-26 LAB — IRON AND TIBC
Iron: 47 ug/dL (ref 28–170)
Saturation Ratios: 14 % (ref 10.4–31.8)
TIBC: 327 ug/dL (ref 250–450)
UIBC: 280 ug/dL

## 2021-03-26 LAB — FERRITIN: Ferritin: 62 ng/mL (ref 11–307)

## 2021-03-26 LAB — HEPATIC FUNCTION PANEL
ALT: 19 (ref 7–35)
AST: 30 (ref 13–35)
Alkaline Phosphatase: 93 (ref 25–125)
Bilirubin, Total: 0.3

## 2021-03-26 LAB — COMPREHENSIVE METABOLIC PANEL
Albumin: 4.3 (ref 3.5–5.0)
Calcium: 9.3 (ref 8.7–10.7)

## 2021-03-26 LAB — CBC: RBC: 3.87 (ref 3.87–5.11)

## 2021-03-26 NOTE — Telephone Encounter (Signed)
Per 5/23 los next appt scheduled and given to patient 

## 2021-03-27 ENCOUNTER — Telehealth: Payer: Self-pay

## 2021-03-27 ENCOUNTER — Ambulatory Visit: Payer: Medicare PPO | Admitting: Oncology

## 2021-03-27 ENCOUNTER — Other Ambulatory Visit: Payer: Medicare PPO

## 2021-03-27 NOTE — Progress Notes (Signed)
    Chronic Care Management Pharmacy Assistant   Name: Katherine Richmond  MRN: [id number] DOB: [birth year]  Reason for Encounter: Medication Review for Gabapentin      Medications: Outpatient Encounter Medications as of 03/27/2021  Medication Sig  . aspirin 81 MG EC tablet Take 81 mg by mouth daily.  Marland Kitchen atorvastatin (LIPITOR) 40 MG tablet TAKE ONE TABLET BY MOUTH EVERY DAY  . estradiol (ESTRACE) 1 MG tablet TAKE 1 TABLET BY MOUTH ONCE DAILY. NEEDS TO CALL FOR APPT SOON  . ferrous sulfate 325 (65 FE) MG tablet Take by mouth.  . fluticasone (FLONASE) 50 MCG/ACT nasal spray Place 2 sprays into both nostrils daily.  Marland Kitchen gabapentin (NEURONTIN) 300 MG capsule TAKE ONE CAPSULE BY MOUTH EVERY DAY AT BEDTIME  . irbesartan-hydrochlorothiazide (AVALIDE) 150-12.5 MG tablet TAKE ONE TABLET BY MOUTH DAILY  . levothyroxine (SYNTHROID) 50 MCG tablet TAKE ONE TABLET BY MOUTH DAILY  . metFORMIN (GLUCOPHAGE) 1000 MG tablet TAKE ONE TABLET BY MOUTH EVERY DAY  . metoprolol succinate (TOPROL-XL) 100 MG 24 hr tablet Take 1 tablet (100 mg total) by mouth daily.  . Multiple Vitamins-Minerals (MULTIPLE VITAMINS/WOMENS PO) Take by mouth.  . Omega-3 Fatty Acids (OMEGA-3 FISH OIL) 1200 MG CAPS Take 1 capsule by mouth in the morning and at bedtime.  . traZODone (DESYREL) 50 MG tablet TAKE ONE TABLET BY MOUTH EVERY DAY AT BEDTIME   No facility-administered encounter medications on file as of 03/27/2021.    Patient stated she has had improvement with taking the Gabapentin300mg  twice a day, she stated the tingling has gotten much better.  She stated she would need a new script to reflect the twice a day dosing.  I reminded her as well that her husband needed to come by the office to sign his PAP forms, and bring proof of income.  She said he had several appointments this week, so they would come by 04/17/21 to sign.   I have let Donette Larry, CPP know.   Clarita Leber, [place] Pharmacist  Assistant [phone]

## 2021-04-09 MED ORDER — GABAPENTIN 300 MG PO CAPS: 1.0000 | ORAL_CAPSULE | Freq: Two times a day (BID) | ORAL | 1 refills | Status: DC

## 2021-04-09 NOTE — Addendum Note (Signed)
Addended by: Donette Larry B on: 04/09/2021 09:48 AM   Modules accepted: Orders

## 2021-05-11 ENCOUNTER — Telehealth: Payer: Self-pay

## 2021-05-11 NOTE — Progress Notes (Signed)
Chronic Care Management Pharmacy Assistant   Name: Katherine Richmond  MRN: [id number] DOB: Dec 26, 1940  Reason for Encounter: Disease State for lipids  Recent office visits:  None since last CCM visit  Recent consult visits:  03/26/21-Dr Bobby Rumpf, Oncology, seen for anemia, follow up 56mos  03/20/21-Dr Crisoforo Oxford, General surgery, CT CHEST ABDOMEN PELVIS for,  Liver metastases   Hospital visits:  None in previous 6 months  Medications: Outpatient Encounter Medications as of 05/11/2021  Medication Sig   aspirin 81 MG EC tablet Take 81 mg by mouth daily.   atorvastatin (LIPITOR) 40 MG tablet TAKE ONE TABLET BY MOUTH EVERY DAY   estradiol (ESTRACE) 1 MG tablet TAKE 1 TABLET BY MOUTH ONCE DAILY. NEEDS TO CALL FOR APPT SOON   ferrous sulfate 325 (65 FE) MG tablet Take by mouth.   fluticasone (FLONASE) 50 MCG/ACT nasal spray Place 2 sprays into both nostrils daily.   gabapentin (NEURONTIN) 300 MG capsule Take 1 capsule (300 mg total) by mouth 2 (two) times daily.   irbesartan-hydrochlorothiazide (AVALIDE) 150-12.5 MG tablet TAKE ONE TABLET BY MOUTH DAILY   levothyroxine (SYNTHROID) 50 MCG tablet TAKE ONE TABLET BY MOUTH DAILY   metFORMIN (GLUCOPHAGE) 1000 MG tablet TAKE ONE TABLET BY MOUTH EVERY DAY   metoprolol succinate (TOPROL-XL) 100 MG 24 hr tablet Take 1 tablet (100 mg total) by mouth daily.   Multiple Vitamins-Minerals (MULTIPLE VITAMINS/WOMENS PO) Take by mouth.   Omega-3 Fatty Acids (OMEGA-3 FISH OIL) 1200 MG CAPS Take 1 capsule by mouth in the morning and at bedtime.   traZODone (DESYREL) 50 MG tablet TAKE ONE TABLET BY MOUTH EVERY DAY AT BEDTIME   No facility-administered encounter medications on file as of 05/11/2021.   Lipid Panel    Component Value Date/Time   CHOL 167 01/23/2021 1147   TRIG 165 (H) 01/23/2021 1147   HDL 55 01/23/2021 1147   LDLCALC 84 01/23/2021 1147    10-year ASCVD risk score: The 10-year ASCVD risk score Mikey Bussing DC Brooke Bonito., et al., 2013) is: 47%   Values  used to calculate the score:     Age: 80 years     Sex: Female     Is Non-Hispanic African American: Yes     Diabetic: Yes     Tobacco smoker: No     Systolic Blood Pressure: 740 mmHg     Is BP treated: Yes     HDL Cholesterol: 55 mg/dL     Total Cholesterol: 167 mg/dL  Current antihyperlipidemic regimen:  aspirin 81 mg ec daily Atorvastatin 40 mg daily Fish Oil 1200 mg bid  Previous antihyperlipidemic medications tried: None  ASCVD risk enhancing conditions: age >47, DM, HTN, and CKD  What recent interventions/DTPs have been made by any provider to improve Cholesterol control since last CPP Visit: Patient is taking her medications, she watches her diet.  She stated the only issue she is currently having is her legs are still tingling, she stated when she started taking the Gabapentin both morning and evening it did calm down some, but it is still there.    Any recent hospitalizations or ED visits since last visit with CPP? No  What diet changes have been made to improve Cholesterol?  Patient watches her diet, she eats limited fried foods  What exercise is being done to improve Cholesterol?  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   Star Rating  Drugs Medication Name Last Fill Days supply Metoprolol  02/05/21  90 (calling in today for refill) Atorvastatin  03/23/21 90 Irbesartan/HCTZ 03/01/21 90 Metformin  03/23/21 90 Levothyroxine  03/13/21 90  Care Gaps: Last annual wellness visit? 06/19/21 Foot Exam: 01/23/21  Clarita Leber, [place] Pharmacist Assistant [phone]

## 2021-05-27 ENCOUNTER — Other Ambulatory Visit: Payer: Self-pay | Admitting: Family Medicine

## 2021-05-29 ENCOUNTER — Other Ambulatory Visit: Payer: Self-pay

## 2021-05-29 MED ORDER — TRAZODONE HCL 50 MG PO TABS: 50.0000 mg | ORAL_TABLET | Freq: Every day | ORAL | 1 refills | Status: DC

## 2021-06-06 ENCOUNTER — Other Ambulatory Visit: Payer: Self-pay

## 2021-06-06 MED ORDER — METFORMIN HCL 1000 MG PO TABS: 1000.0000 mg | ORAL_TABLET | Freq: Every day | ORAL | 1 refills | Status: DC

## 2021-06-06 MED ORDER — ATORVASTATIN CALCIUM 40 MG PO TABS: 40.0000 mg | ORAL_TABLET | Freq: Every day | ORAL | 1 refills | Status: DC

## 2021-06-19 ENCOUNTER — Ambulatory Visit (INDEPENDENT_AMBULATORY_CARE_PROVIDER_SITE_OTHER): Payer: Medicare PPO

## 2021-06-19 ENCOUNTER — Other Ambulatory Visit: Payer: Self-pay

## 2021-06-19 VITALS — BP 132/70 | HR 58 | Resp 16 | Ht 66.0 in | Wt 177.0 lb

## 2021-06-19 DIAGNOSIS — Z23 Encounter for immunization: Secondary | ICD-10-CM

## 2021-06-19 DIAGNOSIS — Z0000 Encounter for general adult medical examination without abnormal findings: Secondary | ICD-10-CM

## 2021-06-19 NOTE — Progress Notes (Signed)
   Covid-19 Vaccination Clinic  Name:  Katherine Richmond    MRN: [id number] DOB: [birth year]  06/19/2021  [name] was observed post Covid-19 immunization for 15 minutes without incident. She was provided with Vaccine Information Sheet and instruction to access the V-Safe system.   [name] was instructed to call 911 with any severe reactions post vaccine: Difficulty breathing  Swelling of face and throat  A fast heartbeat  A bad rash all over body  Dizziness and weakness   Immunizations Administered     Name Date Dose VIS Date Route   Moderna Covid-19 Booster Vaccine 06/19/2021  3:00 PM 0.25 mL 08/23/2020 Intramuscular   Manufacturer: Moderna   Lot: [id number]   [id number]

## 2021-06-19 NOTE — Progress Notes (Signed)
Subjective:   Katherine Richmond is a 79 y.o. female who presents for Medicare Annual (Subsequent) preventive examination.  This wellness visit is conducted by a nurse.  The patient's medications were reviewed and reconciled since the patient's last visit.  History details were provided by the patient.  The history appears to be reliable.    Patient's last AWV was one year ago.   Medical History: Patient history and Family history was reviewed  Medications, Allergies, and preventative health maintenance was reviewed and updated.   Review of Systems    Review of Systems  Constitutional: Negative.   HENT: Negative.    Respiratory: Negative.  Negative for cough, shortness of breath and wheezing.   Cardiovascular: Negative.  Negative for chest pain and palpitations.  Musculoskeletal: Negative.   Neurological: Negative.   Psychiatric/Behavioral: Negative.     Cardiac Risk Factors include: diabetes mellitus;advanced age (>43mn, >>19women)     Objective:    Today's Vitals   06/19/21 1557  BP: 132/70  Pulse: (!) 58  Resp: 16  SpO2: 97%  Weight: 177 lb (80.3 kg)  Height: '5\' 6"'$  (1.676 m)  PainSc: 0-No pain   Body mass index is 28.57 kg/m.  Advanced Directives 03/26/2021 09/26/2020  Does Patient Have a Medical Advance Directive? No No  Would patient like information on creating a medical advance directive? - No - Patient declined    Current Medications (verified) Outpatient Encounter Medications as of 06/19/2021  Medication Sig   aspirin 81 MG EC tablet Take 81 mg by mouth daily.   atorvastatin (LIPITOR) 40 MG tablet Take 1 tablet (40 mg total) by mouth daily.   estradiol (ESTRACE) 1 MG tablet Take 1 tablet by mouth daily.   ferrous sulfate 325 (65 FE) MG tablet Take by mouth.   fluticasone (FLONASE) 50 MCG/ACT nasal spray Place 2 sprays into both nostrils daily.   gabapentin (NEURONTIN) 300 MG capsule Take 1 capsule (300 mg total) by mouth 2 (two) times daily.    irbesartan-hydrochlorothiazide (AVALIDE) 150-12.5 MG tablet TAKE ONE TABLET BY MOUTH DAILY   levothyroxine (SYNTHROID) 50 MCG tablet TAKE ONE TABLET BY MOUTH DAILY   metFORMIN (GLUCOPHAGE) 1000 MG tablet Take 1 tablet (1,000 mg total) by mouth daily.   Multiple Vitamins-Minerals (MULTIPLE VITAMINS/WOMENS PO) Take by mouth.   Omega-3 Fatty Acids (OMEGA-3 FISH OIL) 1200 MG CAPS Take 1 capsule by mouth in the morning and at bedtime.   traZODone (DESYREL) 50 MG tablet Take 1 tablet (50 mg total) by mouth at bedtime.   metoprolol succinate (TOPROL-XL) 100 MG 24 hr tablet Take 1 tablet (100 mg total) by mouth daily.   [DISCONTINUED] atorvastatin (LIPITOR) 40 MG tablet TAKE ONE TABLET BY MOUTH EVERY DAY   [DISCONTINUED] estradiol (ESTRACE) 1 MG tablet TAKE 1 TABLET BY MOUTH ONCE DAILY. NEEDS TO CALL FOR APPT SOON   [DISCONTINUED] levothyroxine (SYNTHROID) 50 MCG tablet TAKE ONE TABLET BY MOUTH DAILY   [DISCONTINUED] metFORMIN (GLUCOPHAGE) 1000 MG tablet TAKE ONE TABLET BY MOUTH EVERY DAY   [DISCONTINUED] traZODone (DESYREL) 50 MG tablet TAKE ONE TABLET BY MOUTH EVERY DAY AT BEDTIME   No facility-administered encounter medications on file as of 06/19/2021.    Allergies (verified) Contrast media [iodinated diagnostic agents], Welchol [colesevelam hcl], and Ace inhibitors   History: Past Medical History:  Diagnosis Date   Anemia    [place]   Leiomyosarcoma   Chronic kidney disease    N18.3a).   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   Liver metastasis   Thyroid disease    Past Surgical History:  Procedure Laterality Date   ABDOMINAL HYSTERECTOMY     EYE SURGERY Right 11/2016   CATARACT REMOVAL   EYE SURGERY Left 12/2016   CATARACT REMOVAL   FOOT SURGERY Bilateral    bunionectomies   NEPHRECTOMY Right 05/13/2018   TONSILLECTOMY  2002   Family History  Problem Relation Age of Onset   Breast  cancer Mother    Colon cancer Mother    Renal cancer Mother    Liver cancer Half-Brother    Multiple sclerosis Half-Brother    Hypertension Other    Transient ischemic attack Other    Glaucoma Other    Social History   Socioeconomic History   Marital status: Married    Spouse name: Katherine Richmond, Katherine Richmond   Number of children: 1  Occupational History   Occupation: RETIRED   Occupation: [employer] - W[employer], THUR, FRI  Tobacco Use   Smoking status: Former    Types: Cigarettes    Quit date: 2012    Years since quitting: 10.6   Smokeless tobacco: Never  Substance and Sexual Activity   Alcohol use: Yes    Alcohol/week: 1.0 standard drink    Types: 1 Glasses of wine per week    Comment: SOCIAL USE ONLY   Drug use: Never   Sexual activity: Not on file   Social Determinants of Health   Financial Resource Strain: Not on file  Food Insecurity: No Food Insecurity   Worried About Charity fundraiser in the Last Year: Never true   Ran Out of Food in the Last Year: Never true  Transportation Needs: No Transportation Needs   Lack of Transportation (Medical): No   Lack of Transportation (Non-Medical): No  Physical Activity: Insufficiently Active   Days of Exercise per Week: 2 days   Minutes of Exercise per Session: 30 min  Stress: Not on file  Social Connections: Not on file    Tobacco Counseling Counseling given: Does not currently use tobacco products   Clinical Intake:  Pre-visit preparation completed: Yes Pain : No/denies pain Pain Score: 0-No pain   BMI - recorded: 28.57 Nutritional Status: BMI 25 -29 Overweight Nutritional Risks: None Diabetes: Yes CBG done?: No Did pt. bring in CBG monitor from home?: No How often do you need to have someone help you when you read instructions, pamphlets, or other written materials from your doctor or pharmacy?: 1 - Never Interpreter Needed?: No   Activities of Daily Living In your present state of health, do you have any difficulty  performing the following activities: 06/19/2021  Hearing? N  Vision? N  Difficulty concentrating or making decisions? N  Walking or climbing stairs? N  Dressing or bathing? N  Doing errands, shopping? N  Preparing Food and eating ? N  Using the Toilet? N  In the past six months, have you accidently leaked urine? N  Do you have problems with loss of bowel control? N  Managing your Medications? N  Managing your Finances? N  Housekeeping or managing your Housekeeping? N  Some recent data might be hidden    Patient Care Team: Rochel Brome, MD as PCP - General (Family Medicine) Jyl Heinz, MD as Surgical Oncologist (Oncology) Marice Potter, MD as Consulting Physician (Oncology) Burnice Logan, [facility] as Pharmacist (Pharmacist) Damaris Schooner, DO (Obstetrics and Gynecology)     Assessment:  This is a routine wellness examination for Amarae.   Dietary issues and exercise activities discussed: Current Exercise Habits: The patient does not participate in regular exercise at present, Exercise limited by: None identified   Goals Addressed   None    Depression Screen PHQ 2/9 Scores 06/19/2021 02/18/2021 12/21/2019  PHQ - 2 Score 0 0 0    Fall Risk Fall Risk  06/19/2021 06/26/2020 03/20/2020 12/21/2019  Falls in the past year? 0 0 0 0  Number falls in past yr: 0 0 0 0  Injury with Fall? 0 - 0 0  Risk for fall due to : No Fall Risks - - -  Follow up Falls evaluation completed - Falls evaluation completed -   Cognitive Function:     6CIT Screen 06/19/2021 12/21/2019  What Year? 0 points 0 points  What month? 0 points 0 points  What time? 0 points 0 points  Count back from 20 0 points 0 points  Months in reverse 0 points 0 points  Repeat phrase 0 points 0 points  Total Score 0 0    Immunizations Immunization History  Administered Date(s) Administered   Fluad Quad(high Dose 65+) 08/03/2020   Influenza-Unspecified 06/16/2019   Moderna Sars-Covid-2 Vaccination 12/31/2019,  01/31/2020, 08/31/2020   Pneumococcal Conjugate-13 08/03/2015   Pneumococcal Polysaccharide-23 11/08/2013   Tdap 01/05/2013   Zoster, Live 09/10/2016    TDAP status: Up to date  Flu Vaccine status: Due, Education has been provided regarding the importance of this vaccine. Advised may receive this vaccine at local pharmacy or Health Dept. Aware to provide a copy of the vaccination record if obtained from local pharmacy or Health Dept. Verbalized acceptance and understanding.  Pneumococcal vaccine status: Up to date  Covid-19 vaccine status: Completed vaccines  Qualifies for Shingles Vaccine? Yes   Zostavax completed Yes   Shingrix Completed?: No.    Education has been provided regarding the importance of this vaccine. Patient has been advised to call insurance company to determine out of pocket expense if they have not yet received this vaccine. Advised may also receive vaccine at local pharmacy or Health Dept. Verbalized acceptance and understanding.  Screening Tests Health Maintenance  Topic Date Due   OPHTHALMOLOGY EXAM  Never done   MAMMOGRAM  Never done   Hepatitis C Screening  Never done   Zoster Vaccines- Shingrix (1 of 2) Never done   COVID-19 Vaccine (4 - Booster for Moderna series) 12/01/2020   DEXA SCAN  12/21/2020   INFLUENZA VACCINE  06/04/2021   HEMOGLOBIN A1C  07/26/2021   FOOT EXAM  01/23/2022   TETANUS/TDAP  01/06/2023   PNA vac Low Risk Adult  Completed   HPV VACCINES  Aged Out    Health Maintenance  Health Maintenance Due  Topic Date Due   OPHTHALMOLOGY EXAM  Never done   MAMMOGRAM  Never done   Hepatitis C Screening  Never done   Zoster Vaccines- Shingrix (1 of 2) Never done   COVID-19 Vaccine (4 - Booster for Moderna series) 12/01/2020   DEXA SCAN  12/21/2020   INFLUENZA VACCINE  06/04/2021    Colorectal cancer screening: Type of screening: Colonoscopy. Completed 2014. Repeat every 10 years  Mammogram status: Due - patient has upcoming  appointment with GYN - they will order and schedule  Bone Density status: patient has upcoming appointment with GYN - they will order and schedule  Vision Screening: Recommended annual ophthalmology exams for early detection of glaucoma and other disorders of the eye.  Is the patient up to date with their annual eye exam?  Yes  Who is the provider or what is the name of the office in which the patient attends annual eye exams? Katherine Richmond, [place]  Dental Screening: Recommended annual dental exams for proper oral hygiene    Plan:    1- Mammogram and bone density - GYN will schedule at upcoming appointment 2- COVID Booster - given today at appointment 3- Flu Vaccine - patient will get at her next office visit with Dr Cox 4- Retinopathy Screening - Records requested from [facility], Katherine Richmond  I have personally reviewed and noted the following in the patient's chart:   Medical and social history Use of alcohol, tobacco or illicit drugs  Current medications and supplements including opioid prescriptions.  Functional ability and status Nutritional status Physical activity Advanced directives List of other physicians Hospitalizations, surgeries, and ER visits in previous 12 months Vitals Screenings to include cognitive, depression, and falls Referrals and appointments  In addition, I have reviewed and discussed with patient certain preventive protocols, quality metrics, and best practice recommendations. A written personalized care plan for preventive services as well as general preventive health recommendations were provided to patient.     Katherine Noe, LPN   [id number]

## 2021-06-19 NOTE — Patient Instructions (Signed)
Bone Density Test A bone density test uses a type of X-ray to measure the amount of calcium and other minerals in a person's bones. It can measure bone density in the hip and the spine. The test is similar to having a regular X-ray. This test may also be called: Bone densitometry. Bone mineral density test. Dual-energy X-ray absorptiometry (DEXA). You may have this test to: Diagnose a condition that causes weak or thin bones (osteoporosis). Screen you for osteoporosis. Predict your risk for a broken bone (fracture). Determine how well your osteoporosis treatment is working. Tell a health care provider about: Any allergies you have. All medicines you are taking, including vitamins, herbs, eye drops, creams, and over-the-counter medicines. Any problems you or family members have had with anesthetic medicines. Any blood disorders you have. Any surgeries you have had. Any medical conditions you have. Whether you are pregnant or may be pregnant. Any medical tests you have had within the past 14 days that used contrast material. What are the risks? Generally, this is a safe test. However, it does expose you to a small amountof radiation, which can slightly increase your cancer risk. What happens before the test? Do not take any calcium supplements within the 24 hours before your test. You will need to remove all metal jewelry, eyeglasses, removable dental appliances, and any other metal objects on your body. What happens during the test?  You will lie down on an exam table. There will be an X-ray generator below you and an imaging device above you. Other devices, such as boxes or braces, may be used to position your body properly for the scan. The machine will slowly scan your body. You will need to keep very still while the machine does the scan. The images will show up on a screen in the room. Images will be examined by a specialist after your test is finished. The procedure may vary among  health care providers and hospitals. What can I expect after the test? It is up to you to get the results of your test. Ask your health care provider,or the department that is doing the test, when your results will be ready. Summary A bone density test is an imaging test that uses a type of X-ray to measure the amount of calcium and other minerals in your bones. The test may be used to diagnose or screen you for a condition that causes weak or thin bones (osteoporosis), predict your risk for a broken bone (fracture), or determine how well your osteoporosis treatment is working. Do not take any calcium supplements within 24 hours before your test. Ask your health care provider, or the department that is doing the test, when your results will be ready. This information is not intended to replace advice given to you by your health care provider. Make sure you discuss any questions you have with your healthcare provider. Document Revised: 04/06/2020 Document Reviewed: 04/06/2020 Elsevier Patient Education  2022 [place] A mammogram is an X-ray of the breasts. This procedure can screen for and detect any changes that may indicate breast cancer. Mammograms are regularly done beginning at age 20 for women with average risk. A man may have amammogram if he has a lump or swelling in his breast tissue. A mammogram can also identify other changes and variations in the breast, such as: Inflammation of the breast tissue (mastitis). An infected area that contains a collection of pus (abscess). A fluid-filled sac (cyst). Tumors that are not cancerous (  benign). Fibrocystic changes. This is when breast tissue becomes denser and can make the tissue feel rope-like or uneven under the skin. Women at higher risk for breast cancer need earlier and more comprehensive screening for abnormal changes. Breast tomosynthesis, or three-dimensional (3D) mammography, and digital breast tomosynthesis are  advanced forms of imagingthat create 3D pictures of the breasts. Tell a health care provider: About any allergies you have. If you have breast implants. If you have had previous breast disease, biopsy, or surgery. If you have a family history of breast cancer. If you are breastfeeding. Whether you are pregnant or may be pregnant. What are the risks? Generally, this is a safe procedure. However, problems may occur, including: Exposure to radiation. Radiation levels are very low with this test. The need for more tests. The mammogram fails to detect certain cancers or the results are misinterpreted. Difficulty with detecting breast cancer in women with dense breasts. What happens before the procedure? Schedule your test about 1-2 weeks after your menstrual period if you are menstruating. This is usually when your breasts are the least tender. If you have had a mammogram done at a different facility in the past, get the mammogram X-rays or have them sent to your current exam facility. The new and old images will be compared. Wash your breasts and underarms on the day of the test. Do not wear deodorants, perfumes, lotions, or powders anywhere on your body on the day of the test. Remove any jewelry from your neck. Wear clothes that you can change into and out of easily. What happens during the procedure?  You will undress from the waist up and put on a gown that opens in the front. You will stand in front of the X-ray machine. Each breast will be placed between two plastic or glass plates. The plates will compress your breast for a few seconds. Try to stay as relaxed as possible. This procedure does not cause any harm to your breasts. Any discomfort you feel will be very brief. X-rays will be taken from different angles of each breast. The procedure may vary among health care providers and hospitals. What can I expect after the procedure? The mammogram will be examined by a specialist  (radiologist). You may need to repeat certain parts of the test, depending on the quality of the images. This is done if the radiologist needs a better view of the breast tissue. You may resume your normal activities. It is up to you to get the results of your procedure. Ask your health care provider, or the department that is doing the procedure, when your results will be ready. Summary A mammogram is an X-ray of the breasts. This procedure can screen for and detect any changes that may indicate breast cancer. A man may have a mammogram if he has a lump or swelling in his breast tissue. If you have had a mammogram done at a different facility in the past, get the mammogram X-rays or have them sent to your current exam facility in order to compare them. Schedule your test about 1-2 weeks after your menstrual period if you are menstruating. Ask when your test results will be ready. Make sure you get your test results. This information is not intended to replace advice given to you by your health care provider. Make sure you discuss any questions you have with your healthcare provider. Document Revised: 08/21/2020 Document Reviewed: 08/21/2020 Elsevier Patient Education  2022 [employer].

## 2021-06-24 DIAGNOSIS — J302 Other seasonal allergic rhinitis: Secondary | ICD-10-CM | POA: Diagnosis not present

## 2021-06-24 DIAGNOSIS — I10 Essential (primary) hypertension: Secondary | ICD-10-CM | POA: Diagnosis not present

## 2021-06-24 DIAGNOSIS — G4700 Insomnia, unspecified: Secondary | ICD-10-CM | POA: Diagnosis not present

## 2021-06-24 DIAGNOSIS — E663 Overweight: Secondary | ICD-10-CM | POA: Diagnosis not present

## 2021-06-24 DIAGNOSIS — C8590 Non-Hodgkin lymphoma, unspecified, unspecified site: Secondary | ICD-10-CM | POA: Diagnosis not present

## 2021-06-24 DIAGNOSIS — E1142 Type 2 diabetes mellitus with diabetic polyneuropathy: Secondary | ICD-10-CM | POA: Diagnosis not present

## 2021-06-24 DIAGNOSIS — E039 Hypothyroidism, unspecified: Secondary | ICD-10-CM | POA: Diagnosis not present

## 2021-06-24 DIAGNOSIS — E785 Hyperlipidemia, unspecified: Secondary | ICD-10-CM | POA: Diagnosis not present

## 2021-06-24 DIAGNOSIS — K08109 Complete loss of teeth, unspecified cause, unspecified class: Secondary | ICD-10-CM | POA: Diagnosis not present

## 2021-06-26 ENCOUNTER — Other Ambulatory Visit: Payer: Self-pay | Admitting: Physician Assistant

## 2021-06-26 DIAGNOSIS — C787 Secondary malignant neoplasm of liver and intrahepatic bile duct: Secondary | ICD-10-CM | POA: Diagnosis not present

## 2021-06-26 DIAGNOSIS — Z9889 Other specified postprocedural states: Secondary | ICD-10-CM | POA: Diagnosis not present

## 2021-06-26 DIAGNOSIS — R918 Other nonspecific abnormal finding of lung field: Secondary | ICD-10-CM | POA: Diagnosis not present

## 2021-06-26 DIAGNOSIS — C480 Malignant neoplasm of retroperitoneum: Secondary | ICD-10-CM | POA: Diagnosis not present

## 2021-07-11 ENCOUNTER — Other Ambulatory Visit: Payer: Self-pay | Admitting: Family Medicine

## 2021-07-17 DIAGNOSIS — Z01419 Encounter for gynecological examination (general) (routine) without abnormal findings: Secondary | ICD-10-CM | POA: Diagnosis not present

## 2021-07-23 ENCOUNTER — Ambulatory Visit: Payer: Medicare PPO | Admitting: Family Medicine

## 2021-07-31 DIAGNOSIS — Z1231 Encounter for screening mammogram for malignant neoplasm of breast: Secondary | ICD-10-CM | POA: Diagnosis not present

## 2021-08-06 ENCOUNTER — Encounter: Payer: Self-pay | Admitting: Family Medicine

## 2021-08-06 ENCOUNTER — Ambulatory Visit: Payer: Medicare PPO | Admitting: Family Medicine

## 2021-08-06 VITALS — BP 132/70 | HR 72 | Temp 97.2°F | Resp 16 | Ht 66.0 in | Wt 182.0 lb

## 2021-08-06 DIAGNOSIS — E1121 Type 2 diabetes mellitus with diabetic nephropathy: Secondary | ICD-10-CM

## 2021-08-06 DIAGNOSIS — N1831 Chronic kidney disease, stage 3a: Secondary | ICD-10-CM | POA: Diagnosis not present

## 2021-08-06 DIAGNOSIS — D171 Benign lipomatous neoplasm of skin and subcutaneous tissue of trunk: Secondary | ICD-10-CM

## 2021-08-06 DIAGNOSIS — I10 Essential (primary) hypertension: Secondary | ICD-10-CM

## 2021-08-06 DIAGNOSIS — E663 Overweight: Secondary | ICD-10-CM

## 2021-08-06 DIAGNOSIS — E1159 Type 2 diabetes mellitus with other circulatory complications: Secondary | ICD-10-CM

## 2021-08-06 DIAGNOSIS — C787 Secondary malignant neoplasm of liver and intrahepatic bile duct: Secondary | ICD-10-CM

## 2021-08-06 DIAGNOSIS — Z6829 Body mass index (BMI) 29.0-29.9, adult: Secondary | ICD-10-CM

## 2021-08-06 DIAGNOSIS — E782 Mixed hyperlipidemia: Secondary | ICD-10-CM

## 2021-08-06 DIAGNOSIS — C480 Malignant neoplasm of retroperitoneum: Secondary | ICD-10-CM | POA: Diagnosis not present

## 2021-08-06 DIAGNOSIS — I152 Hypertension secondary to endocrine disorders: Secondary | ICD-10-CM

## 2021-08-06 DIAGNOSIS — E038 Other specified hypothyroidism: Secondary | ICD-10-CM

## 2021-08-06 DIAGNOSIS — Z23 Encounter for immunization: Secondary | ICD-10-CM

## 2021-08-06 LAB — POCT UA - MICROALBUMIN: Microalbumin Ur, POC: 10 mg/L

## 2021-08-06 NOTE — Progress Notes (Signed)
Subjective:  Patient ID: Katherine Richmond, female    DOB: June 15, 1941  Age: 80 y.o. 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 Patient's hyperlipidemia is well controlled with atorvastatin 40 mg once daily and OTC fish oil 1200 mg twice daily.  Hypertension is well controlled on irbesartan-hydrochlorothiazide 150 mg/12.5 daily and metoprolol XL 100 mg once daily.  Diabetes complicated by neuropathy is being treated with metformin 1000 mg once daily and gabapentin 300 mg twice daily..  Patient's A1c recently was 6.2.  Patient eats healthy and exercises.  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 Hypothyroidism: Currently on levothyroxine 50 mcg once daily in AM. 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  Repeat CT scan on August 2022 showed slight interval increase in size of multiple pulmonary nodules.  This is being followed every 6 months Dr. Crisoforo Oxford.  Patient also has iron deficiency anemia with anemia of chronic disease as well.  Patient sees Dr. Lavera Guise.  Her last iron studies and hemoglobin were improved.  He is seeing her every 6 months.  Patient is concerned about a knot on her bottom.  It seems to be getting larger.  It is causing discomfort with sitting.  Current Outpatient Medications on File Prior to Visit  Medication Sig Dispense Refill   aspirin 81 MG EC tablet Take 81 mg by mouth daily.     atorvastatin (LIPITOR) 40 MG tablet Take 1 tablet (40 mg total) by mouth daily. 90 tablet 1   estradiol (ESTRACE) 1 MG  tablet Take 1 tablet by mouth daily.     ferrous sulfate 325 (65 FE) MG tablet Take by mouth.     fluticasone (FLONASE) 50 MCG/ACT nasal spray Place 2 sprays into both nostrils daily. 48 g 1   gabapentin (NEURONTIN) 300 MG capsule Take 1 capsule (300 mg total) by mouth 2 (two) times daily. 180 capsule 1   irbesartan-hydrochlorothiazide (AVALIDE) 150-12.5 MG tablet TAKE ONE TABLET BY MOUTH DAILY 90 tablet 0   levothyroxine (SYNTHROID) 50 MCG tablet TAKE ONE TABLET BY MOUTH DAILY 90 tablet 1   metFORMIN (GLUCOPHAGE) 1000 MG tablet Take 1 tablet (1,000 mg total) by mouth daily. 90 tablet 1   Multiple Vitamins-Minerals (MULTIPLE VITAMINS/WOMENS PO) Take by mouth.     Omega-3 Fatty Acids (OMEGA-3 FISH OIL) 1200 MG CAPS Take 1 capsule by mouth in the morning and at bedtime.     traZODone (DESYREL) 50 MG tablet Take 1 tablet (50 mg total) by mouth at bedtime. 90 tablet 1   No current facility-administered medications on file prior to visit.   Past Medical History:  Diagnosis Date   Anemia    Cancer ([place]) 07/2018   Leiomyosarcoma   Chronic kidney disease    N18.3a).   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   TONSILLECTOMY  2002    Family History  Problem Relation Age of Onset   Breast cancer Mother    Colon cancer Mother    Renal cancer Mother    Liver cancer Half-Brother    Multiple sclerosis Half-Brother    Hypertension Other    Transient ischemic attack Other    Glaucoma Other     Social History   Socioeconomic History   Marital status: Married    Spouse name: Iona Beard Fales   Number of children: 1   Years of education: Not on file   Highest education level: Not on file  Occupational History   Occupation: RETIRED   Occupation: [employer] - W[employer], [employer]UR, FRI  Tobacco Use   Smoking status: Former    Types: Cigarettes    Quit date: 2012    Years since quitting: 10.7   Smokeless tobacco: Never  Substance and Sexual Activity   Alcohol use: Yes    Alcohol/week: 1.0 standard drink    Types: 1 Glasses of wine per week    Comment: SOCIAL USE ONLY   Drug use: Never   Sexual activity: Not on file  Other Topics Concern   Not on file  Social History Narrative   Not on file   Social Determinants of Health   Financial Resource Strain: Not on file  Food Insecurity: No Food Insecurity   Worried About Running Out of Food in the Last Year: Never true   Ran Out of Food in the Last Year: Never true  Transportation Needs: No Transportation Needs   Lack of Transportation (Medical): No   Lack of Transportation (Non-Medical): No  Physical Activity: Insufficiently Active   Days of Exercise per Week: 2 days   Minutes of Exercise per Session: 30 min  Stress: Not on file  Social Connections: Not on file    Review of Systems  Constitutional:  Negative for chills, fatigue and fever.  HENT:  Negative for congestion, rhinorrhea and sore throat.   Respiratory:  Negative for cough and shortness of breath.   Cardiovascular:  Negative for chest pain.  Gastrointestinal:  Negative for abdominal pain, constipation, diarrhea, nausea and vomiting.  Genitourinary:  Negative for dysuria and urgency.  Musculoskeletal:  Negative for back pain and myalgias.  Neurological:  Negative for dizziness, weakness, light-headedness and headaches.  Psychiatric/Behavioral:  Negative for dysphoric mood. The patient is not nervous/anxious.     Objective:  BP 132/70   Pulse 72   Temp (!) 97.2 F  (36.2 C)   Resp 16   Ht 5\' 6"  (1.676 m)   Wt 182 lb (82.6 kg)   BMI 29.38 kg/m   BP/Weight 08/06/2021 06/19/2021 01/31/5187  Systolic BP 416 606 301  Diastolic BP 70 70 80  Wt. (Lbs) 182 177 179.5  BMI 29.38 28.57 28.97    Physical Exam Vitals reviewed.  Constitutional:      Appearance: Normal appearance.  Neck:     Vascular: No carotid bruit.  Cardiovascular:     Rate and Rhythm: Normal rate and regular rhythm.     Pulses: Normal pulses.     Heart sounds: Normal heart sounds.  Pulmonary:     Effort: Pulmonary effort is normal. No respiratory distress.     Breath sounds: Normal breath sounds.  Abdominal:     General: Abdomen is flat. Bowel sounds are normal.     Palpations: Abdomen is soft.     Tenderness: There is no abdominal tenderness.  Skin:    Comments: Golf ball sized lipoma of left lateral buttock (at her panty line)  Neurological:     Mental Status: She is alert and oriented to person, place, and time.  Psychiatric:        Mood and Affect: Mood normal.        Behavior: Behavior normal.    Diabetic Foot Exam - Simple   Simple Foot Form  08/06/2021  4:20 PM  Visual Inspection No deformities, no ulcerations, no other skin breakdown bilaterally: Yes Sensation Testing See comments: Yes Pulse Check Posterior Tibialis and Dorsalis pulse intact bilaterally: Yes Comments Some decreased sensation of bilateral feet.      Lab Results  Component Value Date   WBC 5.5 08/06/2021   HGB 10.9 (L) 08/06/2021   HCT 32.9 (L) 08/06/2021   PLT 238 08/06/2021   GLUCOSE 105 (H) 08/06/2021   CHOL 178 08/06/2021   TRIG 163 (H) 08/06/2021   HDL 62 08/06/2021   LDLCALC 88 08/06/2021   ALT 16 08/06/2021   AST 24 08/06/2021   NA 140 08/06/2021   K 4.2 08/06/2021   CL 100 08/06/2021   CREATININE 1.27 (H) 08/06/2021   BUN 12 08/06/2021   CO2 24 08/06/2021   TSH 1.270 08/06/2021   HGBA1C 6.2 (H) 08/06/2021   MICROALBUR 10 08/06/2021      Assessment & Plan:    Problem List Items Addressed This Visit       Cardiovascular and Mediastinum   Hypertension complicating diabetes ([place])    Well-controlled. Continue irbesartan-hydrochlorothiazide 150 mg/12.5 daily and metoprolol XL 100 mg once daily.         Digestive   Liver metastases ([place])    Status post left partial hepatectomy.  Monitored every 6 months with a CT scan.        Endocrine   Diabetic glomerulopathy ([place])    Well-controlled.  Continue to eat healthy and exercise. Continue metformin 1000 mg once daily and gabapentin 300 mg twice daily.      Relevant Orders   CBC with Differential/Platelet (Completed)   Comprehensive metabolic panel (Completed)   Hemoglobin A1c (Completed)   POCT UA - Microalbumin (Completed)   Secondary hypothyroidism   Relevant Orders   TSH (Completed)     Genitourinary   Stage 3a chronic kidney disease ([facility])    Stable.  No NSAIDs.        Other   Leiomyosarcoma of retroperitoneum ([place])    Continue follow-up with Dr. Crisoforo Oxford every 6 months.      Hyperlipidemia    Improved.  Continue Lipitor 20 mg once daily and OTC fish oil 1200 mg twice daily.      Relevant Orders   Lipid panel (Completed)   Overweight with body mass index (BMI) of 28 to 28.9 in adult    Continue to eat healthy and exercise.      Lipoma of buttock    Discomfort.    Referral to Dr. Lilia Pro for probable excision..      Relevant Orders   Ambulatory referral to General Surgery   Need for influenza vaccination - Primary   Relevant Orders   Flu Vaccine QUAD High Dose(Fluad) (Completed)  .  No orders of the defined types were placed in this encounter.   Orders Placed This Encounter  Procedures   Flu Vaccine QUAD High Dose(Fluad)   CBC with Differential/Platelet   Comprehensive metabolic panel   Hemoglobin A1c   Lipid panel   TSH   Cardiovascular Risk Assessment   Ambulatory referral to General Surgery   POCT UA - Microalbumin     Follow-up: Return in about 3  months (around 11/06/2021) for chronic fasting.  An After Visit Summary was printed and given to the patient.  Rochel Brome, MD [facility] [phone]

## 2021-08-07 LAB — COMPREHENSIVE METABOLIC PANEL
ALT: 16 IU/L (ref 0–32)
AST: 24 IU/L (ref 0–40)
Albumin/Globulin Ratio: 2 (ref 1.2–2.2)
Albumin: 4.6 g/dL (ref 3.7–4.7)
Alkaline Phosphatase: 91 IU/L (ref 44–121)
BUN/Creatinine Ratio: 9 — ABNORMAL LOW (ref 12–28)
BUN: 12 mg/dL (ref 8–27)
Bilirubin Total: 0.4 mg/dL (ref 0.0–1.2)
CO2: 24 mmol/L (ref 20–29)
Calcium: 9.7 mg/dL (ref 8.7–10.3)
Chloride: 100 mmol/L (ref 96–106)
Creatinine, Ser: 1.27 mg/dL — ABNORMAL HIGH (ref 0.57–1.00)
Globulin, Total: 2.3 g/dL (ref 1.5–4.5)
Glucose: 105 mg/dL — ABNORMAL HIGH (ref 70–99)
Potassium: 4.2 mmol/L (ref 3.5–5.2)
Sodium: 140 mmol/L (ref 134–144)
Total Protein: 6.9 g/dL (ref 6.0–8.5)
eGFR: 44 mL/min/{1.73_m2} — ABNORMAL LOW (ref >59)

## 2021-08-07 LAB — CBC WITH DIFFERENTIAL/PLATELET
Basophils Absolute: 0.1 10*3/uL (ref 0.0–0.2)
Basos: 1 %
EOS (ABSOLUTE): 0.2 10*3/uL (ref 0.0–0.4)
Eos: 3 %
Hematocrit: 32.9 % — ABNORMAL LOW (ref 34.0–46.6)
Hemoglobin: 10.9 g/dL — ABNORMAL LOW (ref 11.1–15.9)
Immature Grans (Abs): 0 10*3/uL (ref 0.0–0.1)
Immature Granulocytes: 0 %
Lymphocytes Absolute: 2.1 10*3/uL (ref 0.7–3.1)
Lymphs: 38 %
MCH: 27.8 pg (ref 26.6–33.0)
MCHC: 33.1 g/dL (ref 31.5–35.7)
MCV: 84 fL (ref 79–97)
Monocytes Absolute: 0.4 10*3/uL (ref 0.1–0.9)
Monocytes: 8 %
Neutrophils Absolute: 2.8 10*3/uL (ref 1.4–7.0)
Neutrophils: 50 %
Platelets: 238 10*3/uL (ref 150–450)
RBC: 3.92 x10E6/uL (ref 3.77–5.28)
RDW: 12.6 % (ref 11.7–15.4)
WBC: 5.5 10*3/uL (ref 3.4–10.8)

## 2021-08-07 LAB — TSH: TSH: 1.27 u[IU]/mL (ref 0.450–4.500)

## 2021-08-07 LAB — LIPID PANEL
Chol/HDL Ratio: 2.9 ratio (ref 0.0–4.4)
Cholesterol, Total: 178 mg/dL (ref 100–199)
HDL: 62 mg/dL (ref >39)
LDL Chol Calc (NIH): 88 mg/dL (ref 0–99)
Triglycerides: 163 mg/dL — ABNORMAL HIGH (ref 0–149)
VLDL Cholesterol Cal: 28 mg/dL (ref 5–40)

## 2021-08-07 LAB — HEMOGLOBIN A1C
Est. average glucose Bld gHb Est-mCnc: 131 mg/dL
Hgb A1c MFr Bld: 6.2 % — ABNORMAL HIGH (ref 4.8–5.6)

## 2021-08-07 NOTE — Progress Notes (Signed)
Blood count abnormal. Anemia stable. Liver function normal.  Kidney function abnormal.  Thyroid function normal.  Cholesterol:well controlled except trigs still a little up, but stable.  HBA1C: 6.2. Diabetes well controlled.  Microalbuminuria: normal.

## 2021-08-10 ENCOUNTER — Telehealth: Payer: Self-pay

## 2021-08-10 NOTE — Chronic Care Management (AMB) (Signed)
Chronic Care Management Pharmacy Assistant   Name: Phyllicia Dudek  MRN: [id number] DOB: [birth year]   Reason for Encounter: Disease State call for DM     Recent office visits:  08/06/21 Rochel Brome MD. Routine Visit. Referral to General Surgery. No med changes. Follow up in 3 months.  06/19/21 Rhae Hammock LPN. Annual Wellness visit. No med changes. Follow up in 1 year.  Recent consult visits:  07/17/21 (OBGYN) [place], [place] Seen for Routine Exam. No med changes.  06/26/21 (General Surgery) Jyl Heinz MD. Seen for Retroperitoneal Sarcoma. CT scan was ordered. No med changes.  Hospital visits:  None since 05/11/21  Medications: Outpatient Encounter Medications as of 08/10/2021  Medication Sig   aspirin 81 MG EC tablet Take 81 mg by mouth daily.   atorvastatin (LIPITOR) 40 MG tablet Take 1 tablet (40 mg total) by mouth daily.   estradiol (ESTRACE) 1 MG tablet Take 1 tablet by mouth daily.   ferrous sulfate 325 (65 FE) MG tablet Take by mouth.   fluticasone (FLONASE) 50 MCG/ACT nasal spray Place 2 sprays into both nostrils daily.   gabapentin (NEURONTIN) 300 MG capsule Take 1 capsule (300 mg total) by mouth 2 (two) times daily.   irbesartan-hydrochlorothiazide (AVALIDE) 150-12.5 MG tablet TAKE ONE TABLET BY MOUTH DAILY   levothyroxine (SYNTHROID) 50 MCG tablet TAKE ONE TABLET BY MOUTH DAILY   metFORMIN (GLUCOPHAGE) 1000 MG tablet Take 1 tablet (1,000 mg total) by mouth daily.   metoprolol succinate (TOPROL-XL) 100 MG 24 hr tablet Take 1 tablet (100 mg total) by mouth daily.   Multiple Vitamins-Minerals (MULTIPLE VITAMINS/WOMENS PO) Take by mouth.   Omega-3 Fatty Acids (OMEGA-3 FISH OIL) 1200 MG CAPS Take 1 capsule by mouth in the morning and at bedtime.   traZODone (DESYREL) 50 MG tablet Take 1 tablet (50 mg total) by mouth at bedtime.   No facility-administered encounter medications on file as of 08/10/2021.    Recent Office Vitals: BP Readings from  Last 3 Encounters:  08/06/21 132/70  06/19/21 132/70  03/26/21 (!) 190/80   Pulse Readings from Last 3 Encounters:  08/06/21 72  06/19/21 (!) 58  03/26/21 60    Wt Readings from Last 3 Encounters:  08/06/21 182 lb (82.6 kg)  06/19/21 177 lb (80.3 kg)  03/26/21 179 lb 8 oz (81.4 kg)     Kidney Function Lab Results  Component Value Date/Time   CREATININE 1.27 (H) 08/06/2021 01:33 PM   CREATININE 1.2 (A) 03/26/2021 12:00 AM   CREATININE 1.34 (H) 01/23/2021 11:47 AM   GFRNONAA 35 (L) 06/26/2020 09:46 AM   GFRAA 40 (L) 06/26/2020 09:46 AM    BMP Latest Ref Rng & Units 08/06/2021 03/26/2021 01/23/2021  Glucose 70 - 99 mg/dL 105(H) - 111(H)  BUN 8 - 27 mg/dL 12 15 12   Creatinine 0.57 - 1.00 mg/dL 1.27(H) 1.2(A) 1.34(H)  BUN/Creat Ratio 12 - 28 9(L) - 9(L)  Sodium 134 - 144 mmol/L 140 136(A) 140  Potassium 3.5 - 5.2 mmol/L 4.2 4.1 4.5  Chloride 96 - 106 mmol/L 100 102 101  CO2 20 - 29 mmol/L 24 28(A) 20  Calcium 8.7 - 10.3 mg/dL 9.7 9.3 9.7   Recent Relevant Labs: Lab Results  Component Value Date/Time   HGBA1C 6.2 (H) 08/06/2021 01:33 PM   HGBA1C 6.2 (H) 01/23/2021 11:47 AM   MICROALBUR 10 08/06/2021 12:02 PM   MICROALBUR 10 06/26/2020 09:50 AM    Kidney Function Lab Results  Component Value Date/Time  CREATININE 1.27 (H) 08/06/2021 01:33 PM   CREATININE 1.2 (A) 03/26/2021 12:00 AM   CREATININE 1.34 (H) 01/23/2021 11:47 AM   GFRNONAA 35 (L) 06/26/2020 09:46 AM   GFRAA 40 (L) 06/26/2020 09:46 AM     Current antihyperglycemic regimen:  Metformin 1000 mg daily  Patient verbally confirms she is taking the above medications as directed. Yes  What recent interventions/DTPs have been made to improve glycemic control:  Pt states no changes  Have there been any recent hospitalizations or ED visits since last visit with CPP? No recent visits   Patient denies hypoglycemic symptoms, including None  Patient denies hyperglycemic symptoms, including none  How often are  you checking your blood sugar? Pt sates she does not check her sugars at home. She mostly controls her sugars through her diet and follows up with her PCP. Latest A1C was on 08/07/21 (6.2)  On insulin? No  During the week, how often does your blood glucose drop below 70? Never  Are you checking your feet daily/regularly? Yes  Adherence Review: Is the patient currently on a STATIN medication? Yes Is the patient currently on ACE/ARB medication? Yes Does the patient have >5 day gap between last estimated fill dates? Yes, there is not other current data for refills on these medications   Care Gaps: Last eye exam / Retinopathy Screening? Never done  Last Annual Wellness Visit? Done on 06/19/21 Last Diabetic Foot Exam? Done on 01/23/21   Star Rating Drugs:  Medication:  Last Fill: Day Supply Irbesartan-hctz 07/03/20 90ds Metformin   09/20/20 90ds Atorvastatin   09/20/20 90ds   Elray Mcgregor, [place] Pharmacist Assistant  [phone]

## 2021-08-11 ENCOUNTER — Other Ambulatory Visit: Payer: Self-pay | Admitting: Physician Assistant

## 2021-08-14 DIAGNOSIS — D171 Benign lipomatous neoplasm of skin and subcutaneous tissue of trunk: Secondary | ICD-10-CM | POA: Diagnosis not present

## 2021-08-16 DIAGNOSIS — R222 Localized swelling, mass and lump, trunk: Secondary | ICD-10-CM | POA: Diagnosis not present

## 2021-08-16 DIAGNOSIS — Z7984 Long term (current) use of oral hypoglycemic drugs: Secondary | ICD-10-CM | POA: Diagnosis not present

## 2021-08-16 DIAGNOSIS — E119 Type 2 diabetes mellitus without complications: Secondary | ICD-10-CM | POA: Diagnosis not present

## 2021-08-16 DIAGNOSIS — D171 Benign lipomatous neoplasm of skin and subcutaneous tissue of trunk: Secondary | ICD-10-CM | POA: Diagnosis not present

## 2021-08-16 DIAGNOSIS — Z87891 Personal history of nicotine dependence: Secondary | ICD-10-CM | POA: Diagnosis not present

## 2021-08-18 ENCOUNTER — Encounter: Payer: Self-pay | Admitting: Family Medicine

## 2021-08-18 DIAGNOSIS — Z23 Encounter for immunization: Secondary | ICD-10-CM | POA: Insufficient documentation

## 2021-08-18 DIAGNOSIS — D171 Benign lipomatous neoplasm of skin and subcutaneous tissue of trunk: Secondary | ICD-10-CM | POA: Insufficient documentation

## 2021-08-18 DIAGNOSIS — E1121 Type 2 diabetes mellitus with diabetic nephropathy: Secondary | ICD-10-CM | POA: Insufficient documentation

## 2021-08-18 DIAGNOSIS — E038 Other specified hypothyroidism: Secondary | ICD-10-CM | POA: Insufficient documentation

## 2021-08-18 NOTE — Assessment & Plan Note (Signed)
Stable.  No NSAIDs. 

## 2021-08-18 NOTE — Assessment & Plan Note (Signed)
Continue follow-up with Dr. Crisoforo Oxford every 6 months.

## 2021-08-18 NOTE — Assessment & Plan Note (Signed)
Well-controlled.  Continue to eat healthy and exercise. Continue metformin 1000 mg once daily and gabapentin 300 mg twice daily.

## 2021-08-18 NOTE — Assessment & Plan Note (Signed)
Continue to eat healthy and exercise. 

## 2021-08-18 NOTE — Assessment & Plan Note (Addendum)
Discomfort.    Referral to Dr. Lilia Pro for probable excision.Marland Kitchen

## 2021-08-18 NOTE — Assessment & Plan Note (Signed)
Improved.  Continue Lipitor 20 mg once daily and OTC fish oil 1200 mg twice daily.

## 2021-08-18 NOTE — Assessment & Plan Note (Signed)
Well-controlled. Continue irbesartan-hydrochlorothiazide 150 mg/12.5 daily and metoprolol XL 100 mg once daily.

## 2021-08-18 NOTE — Assessment & Plan Note (Signed)
Status post left partial hepatectomy.  Monitored every 6 months with a CT scan.

## 2021-09-20 NOTE — Progress Notes (Signed)
[facility]  [address] [place],  [place]  [address] [phone]  Clinic Day:  10/01/2021  Referring physician: Rochel Brome, MD  This document serves as a record of services personally performed by Marice Potter, MD. It was created on their behalf by [facility], a trained medical scribe. The creation of this record is based on the scribe's personal observations and the provider's statements to them.  HISTORY OF PRESENT ILLNESS:  The patient is a 80 y.o. 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 She comes in today for routine follow-up.  Since her last visit, the patient has been doing well. She denies having increased fatigue or any overt forms of blood loss which concern her for progressive anemia.    PHYSICAL EXAM:  Blood pressure (!) 175/80, pulse 61, temperature 97.7 F (36.5 C), resp. rate 14, height 5\' 6"  (1.676 m), weight 181 lb 8 oz (82.3 kg), SpO2 95 %. Wt Readings from Last 3 Encounters:  10/01/21 181 lb 8 oz (82.3 kg)  08/06/21 182 lb (82.6 kg)  06/19/21 177 lb (80.3 kg)   Body mass index is 29.29 kg/m. Performance status (ECOG): 0 - Asymptomatic Physical Exam Constitutional:      Appearance: Normal appearance. She is not ill-appearing.  HENT:     Mouth/Throat:     Mouth: Mucous membranes are moist.     Pharynx: Oropharynx is clear. No oropharyngeal exudate or posterior oropharyngeal erythema.  Cardiovascular:     Rate and Rhythm: Normal rate and regular rhythm.     Heart sounds: No murmur heard.   No friction rub. No gallop.  Pulmonary:     Effort: Pulmonary effort is normal. No respiratory distress.     Breath sounds: Normal breath sounds. No  wheezing, rhonchi or rales.  Abdominal:     General: Bowel sounds are normal. There is no distension.     Palpations: Abdomen is soft. There is no mass.     Tenderness: There is no abdominal tenderness.  Musculoskeletal:        General: No swelling.     Right lower leg: No edema.     Left lower leg: No edema.  Lymphadenopathy:     Cervical: No cervical adenopathy.     Upper Body:     Right upper body: No supraclavicular or axillary adenopathy.     Left upper body: No supraclavicular or axillary adenopathy.     Lower Body: No right inguinal adenopathy. No left inguinal adenopathy.  Skin:    General: Skin is warm.     Coloration: Skin is not jaundiced.     Findings: No lesion or rash.  Neurological:     General: No focal deficit present.     Mental Status: She is alert and oriented to person, place, and time. Mental status is at baseline.  Psychiatric:        Mood and Affect: Mood normal.        Behavior: Behavior normal.        Thought Content: Thought content normal.    LABS:    Latest Reference Range & Units 10/01/21 00:00  Sodium 137 - 147  136 ! (E)  Potassium 3.4 - 5.3  4.3 (E)  Chloride 99 - 108  105 (E)  CO2 13 - 22  26 ! (E)  Glucose  106 (E)  BUN 4 - 21  13 (E)  Creatinine 0.5 - 1.1  1.1 (E)  Calcium 8.7 - 10.7  8.9 (E)  Alkaline Phosphatase 25 - 125  83 (E)  Albumin 3.5 - 5.0  4.0 (E)  AST 13 - 35  36 ! (E)  ALT 7 - 35  23 (E)  Bilirubin, Total  0.5 (E)  WBC  4.9 (E)  RBC 3.87 - 5.11  3.83 ! (E)  Hemoglobin 12.0 - 16.0  11.0 ! (E)  HCT 36 - 46  33 ! (E)  Platelets 150 - 399  210 (E)  NEUT#  2.74 (E)    Latest Reference Range & Units 10/01/21 09:43  Iron 28 - 170 ug/dL 52  UIBC ug/dL 282  TIBC 250 - 450 ug/dL 334  Saturation Ratios 10.4 - 31.8 % 16  Ferritin 11 - 307 ng/mL 43    ASSESSMENT & PLAN:  Assessment/Plan:  A 80 y.o. female with a history of mild anemia secondary to iron deficiency and mild renal insufficiency.  When evaluating her labs  today, I am pleased with her hemoglobin of 11.  Her iron parameters also remain normal.  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  As her hemoglobin remains well above 10, she will continue to be followed conservatively.  I will see her back in 6 months for repeat clinical assessment.  The patient understands all the plans discussed today and is in agreement with them.     I, Rita Ohara, am acting as scribe for Marice Potter, MD    I have reviewed this report as typed by the medical scribe, and it is complete and accurate.  Dequincy Macarthur Critchley, MD

## 2021-09-23 ENCOUNTER — Other Ambulatory Visit: Payer: Self-pay | Admitting: Family Medicine

## 2021-10-01 ENCOUNTER — Inpatient Hospital Stay: Payer: Medicare PPO | Attending: Oncology | Admitting: Oncology

## 2021-10-01 ENCOUNTER — Telehealth: Payer: Self-pay | Admitting: Oncology

## 2021-10-01 ENCOUNTER — Other Ambulatory Visit: Payer: Self-pay | Admitting: Hematology and Oncology

## 2021-10-01 ENCOUNTER — Inpatient Hospital Stay: Payer: Medicare PPO

## 2021-10-01 ENCOUNTER — Other Ambulatory Visit: Payer: Self-pay

## 2021-10-01 VITALS — BP 175/80 | HR 61 | Temp 97.7°F | Resp 14 | Ht 66.0 in | Wt 181.5 lb

## 2021-10-01 DIAGNOSIS — I129 Hypertensive chronic kidney disease with stage 1 through stage 4 chronic kidney disease, or unspecified chronic kidney disease: Secondary | ICD-10-CM | POA: Diagnosis not present

## 2021-10-01 DIAGNOSIS — D631 Anemia in chronic kidney disease: Secondary | ICD-10-CM | POA: Diagnosis not present

## 2021-10-01 DIAGNOSIS — N1831 Chronic kidney disease, stage 3a: Secondary | ICD-10-CM | POA: Diagnosis not present

## 2021-10-01 DIAGNOSIS — C480 Malignant neoplasm of retroperitoneum: Secondary | ICD-10-CM | POA: Diagnosis not present

## 2021-10-01 DIAGNOSIS — N189 Chronic kidney disease, unspecified: Secondary | ICD-10-CM | POA: Diagnosis not present

## 2021-10-01 LAB — IRON AND TIBC
Iron: 52 ug/dL (ref 28–170)
Saturation Ratios: 16 % (ref 10.4–31.8)
TIBC: 334 ug/dL (ref 250–450)
UIBC: 282 ug/dL

## 2021-10-01 LAB — CBC AND DIFFERENTIAL
HCT: 33 — AB (ref 36–46)
Hemoglobin: 11 — AB (ref 12.0–16.0)
Neutrophils Absolute: 2.74
Platelets: 210 (ref 150–399)
WBC: 4.9

## 2021-10-01 LAB — BASIC METABOLIC PANEL
BUN: 13 (ref 4–21)
CO2: 26 — AB (ref 13–22)
Chloride: 105 (ref 99–108)
Creatinine: 1.1 (ref 0.5–1.1)
Glucose: 106
Potassium: 4.3 (ref 3.4–5.3)
Sodium: 136 — AB (ref 137–147)

## 2021-10-01 LAB — HEPATIC FUNCTION PANEL
ALT: 23 (ref 7–35)
AST: 36 — AB (ref 13–35)
Alkaline Phosphatase: 83 (ref 25–125)
Bilirubin, Total: 0.5

## 2021-10-01 LAB — CBC: RBC: 3.83 — AB (ref 3.87–5.11)

## 2021-10-01 LAB — FERRITIN: Ferritin: 43 ng/mL (ref 11–307)

## 2021-10-01 LAB — COMPREHENSIVE METABOLIC PANEL
Albumin: 4 (ref 3.5–5.0)
Calcium: 8.9 (ref 8.7–10.7)

## 2021-10-01 NOTE — Telephone Encounter (Signed)
11/28 los next appt scheduled and given to patient

## 2021-10-02 ENCOUNTER — Other Ambulatory Visit: Payer: Self-pay | Admitting: Family Medicine

## 2021-11-13 ENCOUNTER — Telehealth: Payer: Self-pay

## 2021-11-13 NOTE — Chronic Care Management (AMB) (Signed)
Chronic Care Management Pharmacy Assistant   Name: Katherine Richmond  MRN: [id number] DOB: 12/22/1940   Reason for Encounter: Disease State call for DM  Recent office visits:  None  Recent consult visits:  10/01/21 (Oncology) Lavera Guise MD. Seen for Anemia. No med changes.   Hospital visits:  None  Medications: Outpatient Encounter Medications as of 11/13/2021  Medication Sig   aspirin 81 MG EC tablet Take 81 mg by mouth daily.   atorvastatin (LIPITOR) 40 MG tablet Take 1 tablet (40 mg total) by mouth daily.   estradiol (ESTRACE) 1 MG tablet Take 1 tablet by mouth daily.   ferrous sulfate 325 (65 FE) MG tablet Take by mouth.   fluticasone (FLONASE) 50 MCG/ACT nasal spray Place 2 sprays into both nostrils daily.   gabapentin (NEURONTIN) 300 MG capsule Take 1 capsule (300 mg total) by mouth 2 (two) times daily.   irbesartan-hydrochlorothiazide (AVALIDE) 150-12.5 MG tablet TAKE ONE TABLET BY MOUTH DAILY   levothyroxine (SYNTHROID) 50 MCG tablet TAKE ONE TABLET BY MOUTH DAILY   metFORMIN (GLUCOPHAGE) 1000 MG tablet Take 1 tablet (1,000 mg total) by mouth daily.   metoprolol succinate (TOPROL-XL) 100 MG 24 hr tablet Take 1 tablet (100 mg total) by mouth daily.   Multiple Vitamins-Minerals (MULTIPLE VITAMINS/WOMENS PO) Take by mouth.   Omega-3 Fatty Acids (OMEGA-3 FISH OIL) 1200 MG CAPS Take 1 capsule by mouth in the morning and at bedtime.   traZODone (DESYREL) 50 MG tablet Take 1 tablet (50 mg total) by mouth at bedtime.   No facility-administered encounter medications on file as of 11/13/2021.   Recent Relevant Labs: Lab Results  Component Value Date/Time   HGBA1C 6.2 (H) 08/06/2021 01:33 PM   HGBA1C 6.2 (H) 01/23/2021 11:47 AM   MICROALBUR 10 08/06/2021 12:02 PM   MICROALBUR 10 06/26/2020 09:50 AM    Kidney Function Lab Results  Component Value Date/Time   CREATININE 1.1 10/01/2021 12:00 AM   CREATININE 1.27 (H) 08/06/2021 01:33 PM   CREATININE 1.2 (A)  03/26/2021 12:00 AM   CREATININE 1.34 (H) 01/23/2021 11:47 AM   GFRNONAA 35 (L) 06/26/2020 09:46 AM   GFRAA 40 (L) 06/26/2020 09:46 AM     Current antihyperglycemic regimen:  Metformin 1000 mg daily  Patient verbally confirms she is taking the above medications as directed. Yes  What recent interventions/DTPs have been made to improve glycemic control:  No recent changes   Have there been any recent hospitalizations or ED visits since last visit with CPP? No  Patient denies hypoglycemic symptoms  Patient denies hyperglycemic symptoms  How often are you checking your blood sugar? Not very often   What are your blood sugars ranging? Pt stated she never checks her BS at home unless she feels she needs too. She stated the last few times she has checked, the run between 111-113  On insulin? No  During the week, how often does your blood glucose drop below 70? Never  Are you checking your feet daily/regularly? Yes  Adherence Review: Is the patient currently on a STATIN medication? Yes Is the patient currently on ACE/ARB medication? Yes Does the patient have >5 day gap between last estimated fill dates? CPP to review  Care Gaps: Last eye exam / Retinopathy Screening? Never done  Last Annual Wellness Visit? Done on 06/19/21 Last Diabetic Foot Exam? Done on 01/23/21   Star Rating Drugs:  Medication:  Last Fill: Day Supply Metformin   07/30/21 90ds  Med hx is out  of date so pt reported her last fill was this date and she had some left over from another supply. She stated she still gets through [facility] Pharmacist Assistant  [phone]

## 2021-11-16 NOTE — Telephone Encounter (Signed)
F/U with PCP on 12/10/21. Patient appears to be very non-compliant on Metformin (Last filled beginning of September). She denies non-compliance to CCM team, will let PCP know

## 2021-11-20 ENCOUNTER — Ambulatory Visit: Payer: Medicare PPO | Admitting: Family Medicine

## 2021-11-25 ENCOUNTER — Other Ambulatory Visit: Payer: Self-pay | Admitting: Physician Assistant

## 2021-11-28 ENCOUNTER — Other Ambulatory Visit: Payer: Self-pay | Admitting: Nurse Practitioner

## 2021-12-09 NOTE — Progress Notes (Signed)
Subjective:  Patient ID: Katherine Richmond, female    DOB: 1941/10/28  Age: 81 y.o. MRN: [id number]  Chief Complaint  Patient presents with   Diabetes   Hypertension   Hyperlipidemia    Diabetes:  Complications: Neuropathy Glucose checking: not Hypoglycemia: none Most recent A1C: 6.2 Current medications: Metformin 1000mg  take 1 tablet by mouth daily, Gabapentin 300mg  take 1 capsule by mouth twice daily. Last Eye Exam: Katherine Richmond is due next month.  Foot checks: daily  Hyperlipidemia: Current medications: Atorvastatin 40mg  take 1 tablet daily, Omega-3 Fish Oil 1200mg  take 1 capsule twice daily.  Hypertension: Complications: CKD stage 3a. Current medications: Irbesartan-HYDROCHLOROTHIAZIDE 150-12.5mg  take 1 tablet by mouth daily, Metoprolol Succinate 100mg  take 1 tablet by mouth daily, Aspirin 81mg  daily.  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  Repeat CT scan on August 2022 showed slight interval increase in size of multiple pulmonary nodules.  This is being followed every 6 months Dr. Crisoforo Oxford. Repeat ct scan due in 12/2021.  Hypothyroidism: Current medications: Levothyroxine 11mcg take 1 tablet daily.  Iron Deficiency Anemia: Patient also has iron deficiency anemia with anemia of chronic disease as well.  Patient sees Dr. Lavera Guise.  Her last iron studies and hemoglobin were improved.  He is seeing her every 6 months.  Diet: healthy Exercise: active.    Current Outpatient Medications on File Prior to Visit  Medication Sig Dispense Refill   aspirin 81 MG EC tablet Take 81 mg by mouth daily.     atorvastatin (LIPITOR) 40 MG tablet Take 1 tablet (40 mg total) by mouth daily. 90 tablet 1   estradiol (ESTRACE) 1 MG tablet Take 1 tablet by mouth daily.     ferrous sulfate 325 (65 FE) MG tablet Take by  mouth.     fluticasone (FLONASE) 50 MCG/ACT nasal spray Place 2 sprays into both nostrils daily. 48 g 1   gabapentin (NEURONTIN) 300 MG capsule Take 1 capsule (300 mg total) by mouth 2 (two) times daily. 180 capsule 1   levothyroxine (SYNTHROID) 50 MCG tablet TAKE ONE TABLET BY MOUTH DAILY 90 tablet 1   metFORMIN (GLUCOPHAGE) 1000 MG tablet Take 1 tablet (1,000 mg total) by mouth daily. 90 tablet 1   metoprolol succinate (TOPROL-XL) 100 MG 24 hr tablet Take 1 tablet (100 mg total) by mouth daily. 90 tablet 1   Multiple Vitamins-Minerals (MULTIPLE VITAMINS/WOMENS PO) Take by mouth.     Omega-3 Fatty Acids (OMEGA-3 FISH OIL) 1200 MG CAPS Take 1 capsule by mouth in the morning and at bedtime.     traZODone (DESYREL) 50 MG tablet Take 1 tablet (50 mg total) by mouth at bedtime. 90 tablet 1   No current facility-administered medications on file prior to visit.   Past Medical History:  Diagnosis Date   Anemia    Cancer ([place]) 07/2018   Leiomyosarcoma   Chronic kidney disease    N18.3a).   Diabetes mellitus without complication ([place])    Hyperlipidemia    Hypertension    metastatic leimyosarcoma 11/2019   Liver metastasis   Thyroid disease    Past Surgical History:  Procedure Laterality Date   ABDOMINAL HYSTERECTOMY     EYE SURGERY Right 11/2016   CATARACT REMOVAL   EYE SURGERY Left 12/2016   CATARACT REMOVAL   FOOT SURGERY Bilateral    bunionectomies   LAPAROSCOPIC  PARTIAL HEPATECTOMY Left 11/18/2019   NEPHRECTOMY Right 05/13/2018   Resection of retroperitoneal mass and right adrenalectomy due to leiomyosarcoma.   TONSILLECTOMY  2002    Family History  Problem Relation Age of Onset   Breast cancer Mother    Colon cancer Mother    Renal cancer Mother    Liver cancer Half-Brother    Multiple sclerosis Half-Brother    Hypertension Other    Transient ischemic attack Other    Glaucoma Other    Social History   Socioeconomic History   Marital status: Married    Spouse name:  Katherine Richmond   Number of children: 1   Years of education: Not on file   Highest education level: Not on file  Occupational History   Occupation: RETIRED   Occupation: [employer] - W[employer], [employer], FRI  Tobacco Use   Smoking status: Former    Types: Cigarettes    Quit date: 2012    Years since quitting: 11.1   Smokeless tobacco: Never  Substance and Sexual Activity   Alcohol use: Yes    Alcohol/week: 1.0 standard drink    Types: 1 Glasses of wine per week    Comment: SOCIAL USE ONLY   Drug use: Never   Sexual activity: Not on file  Other Topics Concern   Not on file  Social History Narrative   Not on file   Social Determinants of Health   Financial Resource Strain: Not on file  Food Insecurity: No Food Insecurity   Worried About Running Out of Food in the Last Year: Never true   Ran Out of Food in the Last Year: Never true  Transportation Needs: No Transportation Needs   Lack of Transportation (Medical): No   Lack of Transportation (Non-Medical): No  Physical Activity: Insufficiently Active   Days of Exercise per Week: 2 days   Minutes of Exercise per Session: 30 min  Stress: Not on file  Social Connections: Not on file    Review of Systems  Constitutional:  Positive for fatigue. Negative for appetite change and fever.  HENT:  Negative for congestion, ear pain, sinus pressure and sore throat.   Eyes:  Negative for pain.  Respiratory:  Negative for cough, chest tightness, shortness of breath and wheezing.   Cardiovascular:  Negative for chest pain and palpitations.  Gastrointestinal:  Negative for abdominal pain, constipation, diarrhea, nausea and vomiting.  Genitourinary:  Negative for dysuria and hematuria.  Musculoskeletal:  Positive for arthralgias (right hip pain). Negative for back pain, joint swelling and myalgias.  Skin:  Negative for rash.  Neurological:  Negative for dizziness, weakness and headaches.  Psychiatric/Behavioral:  Negative for dysphoric mood. The  patient is not nervous/anxious.     Objective:  BP 138/74    Pulse 70    Temp (!) 96.2 F (35.7 C)    Resp 16    Ht 5\' 6"  (1.676 m)    Wt 183 lb 9.6 oz (83.3 kg)    SpO2 99%    BMI 29.63 kg/m   BP/Weight 12/10/2021 10/01/2021 35/03/7321  Systolic BP 025 427 062  Diastolic BP 74 80 70  Wt. (Lbs) 183.6 181.5 182  BMI 29.63 29.29 29.38    Physical Exam Vitals reviewed.  Constitutional:      Appearance: Normal appearance. She is normal weight.  Neck:     Vascular: No carotid bruit.  Cardiovascular:     Rate and Rhythm: Normal rate and regular rhythm.     Heart sounds:  Normal heart sounds.  Pulmonary:     Effort: Pulmonary effort is normal. No respiratory distress.     Breath sounds: Normal breath sounds.  Abdominal:     General: Abdomen is flat. Bowel sounds are normal.     Palpations: Abdomen is soft.     Tenderness: There is no abdominal tenderness.  Neurological:     Mental Status: She is alert and oriented to person, place, and time.  Psychiatric:        Mood and Affect: Mood normal.        Behavior: Behavior normal.    Diabetic Foot Exam - Simple   Simple Foot Form  12/10/2021  5:23 PM  Visual Inspection No deformities, no ulcerations, no other skin breakdown bilaterally: Yes Sensation Testing Intact to touch and monofilament testing bilaterally: Yes Pulse Check Posterior Tibialis and Dorsalis pulse intact bilaterally: Yes Comments      Lab Results  Component Value Date   WBC 4.9 10/01/2021   HGB 11.0 (A) 10/01/2021   HCT 33 (A) 10/01/2021   PLT 210 10/01/2021   GLUCOSE 105 (H) 08/06/2021   CHOL 178 08/06/2021   TRIG 163 (H) 08/06/2021   HDL 62 08/06/2021   LDLCALC 88 08/06/2021   ALT 23 10/01/2021   AST 36 (A) 10/01/2021   NA 136 (A) 10/01/2021   K 4.3 10/01/2021   CL 105 10/01/2021   CREATININE 1.1 10/01/2021   BUN 13 10/01/2021   CO2 26 (A) 10/01/2021   TSH 1.270 08/06/2021   HGBA1C 6.2 (H) 08/06/2021   MICROALBUR 10 08/06/2021       Assessment & Plan:   Problem List Items Addressed This Visit       Cardiovascular and Mediastinum   Hypertension complicating diabetes ([place])    Change irbesartan/hctz from 150/12.5 to 300/12.5 mg daily.  Continue metoprolol xl 100 mg daily . Continue aspirin 81 mg daily.       Relevant Medications   irbesartan-hydrochlorothiazide (AVALIDE) 300-12.5 MG tablet   Other Relevant Orders   CBC With Diff/Platelet   Comprehensive metabolic panel     Digestive   Liver metastases (HCC)    Follow up with Dr. Crisoforo Oxford next week.        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 Recommend annual eye exams. Medicines: metformin 1000 mg daily.  Continue to work on eating a healthy diet and exercise.  Labs drawn today.         Relevant Medications   irbesartan-hydrochlorothiazide (AVALIDE) 300-12.5 MG tablet   Other Relevant Orders   Hemoglobin A1c   Microalbumin / creatinine urine ratio   Secondary hypothyroidism    The current medical regimen is effective;  continue present plan and medications.         Genitourinary   Stage 3a chronic kidney disease ([place]) - Primary    Stable. Consider addition of kerendia.         Other   Leiomyosarcoma of retroperitoneum ([place])    Mets to liver.       Hyperlipidemia    Well controlled.  No changes to medicines.  Continue to work on eating a healthy diet and exercise.  Labs drawn today.        Relevant Medications   irbesartan-hydrochlorothiazide (AVALIDE) 300-12.5 MG tablet   Other Relevant Orders   Lipid panel   BMI 29.0-29.9,adult    Recommend continue to work on eating healthy diet and exercise.  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  Meds ordered this encounter  Medications    irbesartan-hydrochlorothiazide (AVALIDE) 300-12.5 MG tablet    Sig: Take 1 tablet by mouth daily.    Dispense:  90 tablet    Refill:  1    Orders Placed This Encounter  Procedures   CBC With Diff/Platelet   Comprehensive metabolic panel   Lipid panel   Hemoglobin A1c   Microalbumin / creatinine urine ratio    Total time spent on today's visit was greater than 30 minutes, including both face-to-face time and nonface-to-face time personally spent on review of chart (labs and imaging), discussing labs and goals, discussing further work-up, treatment options, referrals to specialist if needed, reviewing outside records of pertinent, answering patient's questions, and coordinating care.  Follow-up: Return in about 4 months (around 04/09/2022).  An After Visit Summary was printed and given to the patient.  Rochel Brome, MD [facility] [phone]

## 2021-12-10 ENCOUNTER — Ambulatory Visit (INDEPENDENT_AMBULATORY_CARE_PROVIDER_SITE_OTHER): Payer: Medicare PPO

## 2021-12-10 ENCOUNTER — Encounter: Payer: Self-pay | Admitting: Family Medicine

## 2021-12-10 ENCOUNTER — Other Ambulatory Visit: Payer: Self-pay

## 2021-12-10 ENCOUNTER — Ambulatory Visit: Payer: Medicare PPO | Admitting: Family Medicine

## 2021-12-10 VITALS — BP 138/74 | HR 70 | Temp 96.2°F | Resp 16 | Ht 66.0 in | Wt 183.6 lb

## 2021-12-10 DIAGNOSIS — Z6829 Body mass index (BMI) 29.0-29.9, adult: Secondary | ICD-10-CM | POA: Diagnosis not present

## 2021-12-10 DIAGNOSIS — N1831 Chronic kidney disease, stage 3a: Secondary | ICD-10-CM | POA: Diagnosis not present

## 2021-12-10 DIAGNOSIS — Z23 Encounter for immunization: Secondary | ICD-10-CM | POA: Diagnosis not present

## 2021-12-10 DIAGNOSIS — E1169 Type 2 diabetes mellitus with other specified complication: Secondary | ICD-10-CM

## 2021-12-10 DIAGNOSIS — C787 Secondary malignant neoplasm of liver and intrahepatic bile duct: Secondary | ICD-10-CM

## 2021-12-10 DIAGNOSIS — E038 Other specified hypothyroidism: Secondary | ICD-10-CM

## 2021-12-10 DIAGNOSIS — C480 Malignant neoplasm of retroperitoneum: Secondary | ICD-10-CM | POA: Diagnosis not present

## 2021-12-10 DIAGNOSIS — I10 Essential (primary) hypertension: Secondary | ICD-10-CM

## 2021-12-10 DIAGNOSIS — E1159 Type 2 diabetes mellitus with other circulatory complications: Secondary | ICD-10-CM

## 2021-12-10 DIAGNOSIS — E782 Mixed hyperlipidemia: Secondary | ICD-10-CM | POA: Diagnosis not present

## 2021-12-10 DIAGNOSIS — E1121 Type 2 diabetes mellitus with diabetic nephropathy: Secondary | ICD-10-CM

## 2021-12-10 DIAGNOSIS — I152 Hypertension secondary to endocrine disorders: Secondary | ICD-10-CM | POA: Diagnosis not present

## 2021-12-10 MED ORDER — IRBESARTAN-HYDROCHLOROTHIAZIDE 300-12.5 MG PO TABS: 1.0000 | ORAL_TABLET | Freq: Every day | ORAL | 1 refills | Status: DC

## 2021-12-10 NOTE — Assessment & Plan Note (Signed)
Follow up with Dr. Crisoforo Oxford next week.

## 2021-12-10 NOTE — Assessment & Plan Note (Signed)
Well controlled.  ?No changes to medicines.  ?Continue to work on eating a healthy diet and exercise.  ?Labs drawn today.  ?

## 2021-12-10 NOTE — Assessment & Plan Note (Signed)
Stable. Consider addition of kerendia.

## 2021-12-10 NOTE — Assessment & Plan Note (Signed)
Change irbesartan/hctz from 150/12.5 to 300/12.5 mg daily.  Continue metoprolol xl 100 mg daily . Continue aspirin 81 mg daily.

## 2021-12-10 NOTE — Patient Instructions (Signed)
Recommend increase irbsartan 150/12.5 mg daily to 300/12.5 mg daily.

## 2021-12-10 NOTE — Assessment & Plan Note (Signed)
The current medical regimen is effective;  continue present plan and medications.  

## 2021-12-10 NOTE — Assessment & Plan Note (Signed)
Mets to liver.

## 2021-12-10 NOTE — Assessment & Plan Note (Signed)
Recommend continue to work on eating healthy diet and exercise.  

## 2021-12-10 NOTE — Assessment & Plan Note (Signed)
Control: good Recommend check feet daily. Recommend annual eye exams. Medicines: metformin 1000 mg daily.  Continue to work on eating a healthy diet and exercise.  Labs drawn today.

## 2021-12-11 LAB — LIPID PANEL
Chol/HDL Ratio: 3 ratio (ref 0.0–4.4)
Cholesterol, Total: 186 mg/dL (ref 100–199)
HDL: 62 mg/dL (ref >39)
LDL Chol Calc (NIH): 93 mg/dL (ref 0–99)
Triglycerides: 180 mg/dL — ABNORMAL HIGH (ref 0–149)
VLDL Cholesterol Cal: 31 mg/dL (ref 5–40)

## 2021-12-11 LAB — COMPREHENSIVE METABOLIC PANEL
ALT: 16 IU/L (ref 0–32)
AST: 19 IU/L (ref 0–40)
Albumin/Globulin Ratio: 1.9 (ref 1.2–2.2)
Albumin: 4.4 g/dL (ref 3.7–4.7)
Alkaline Phosphatase: 82 IU/L (ref 44–121)
BUN/Creatinine Ratio: 10 — ABNORMAL LOW (ref 12–28)
BUN: 15 mg/dL (ref 8–27)
Bilirubin Total: 0.3 mg/dL (ref 0.0–1.2)
CO2: 22 mmol/L (ref 20–29)
Calcium: 9.5 mg/dL (ref 8.7–10.3)
Chloride: 101 mmol/L (ref 96–106)
Creatinine, Ser: 1.44 mg/dL — ABNORMAL HIGH (ref 0.57–1.00)
Globulin, Total: 2.3 g/dL (ref 1.5–4.5)
Glucose: 114 mg/dL — ABNORMAL HIGH (ref 70–99)
Potassium: 4.1 mmol/L (ref 3.5–5.2)
Sodium: 137 mmol/L (ref 134–144)
Total Protein: 6.7 g/dL (ref 6.0–8.5)
eGFR: 37 mL/min/{1.73_m2} — ABNORMAL LOW (ref >59)

## 2021-12-11 LAB — CBC WITH DIFF/PLATELET
Basophils Absolute: 0 10*3/uL (ref 0.0–0.2)
Basos: 1 %
EOS (ABSOLUTE): 0.1 10*3/uL (ref 0.0–0.4)
Eos: 2 %
Hematocrit: 31.9 % — ABNORMAL LOW (ref 34.0–46.6)
Hemoglobin: 11.2 g/dL (ref 11.1–15.9)
Immature Grans (Abs): 0 10*3/uL (ref 0.0–0.1)
Immature Granulocytes: 0 %
Lymphocytes Absolute: 1.6 10*3/uL (ref 0.7–3.1)
Lymphs: 42 %
MCH: 29.2 pg (ref 26.6–33.0)
MCHC: 35.1 g/dL (ref 31.5–35.7)
MCV: 83 fL (ref 79–97)
Monocytes Absolute: 0.3 10*3/uL (ref 0.1–0.9)
Monocytes: 9 %
Neutrophils Absolute: 1.8 10*3/uL (ref 1.4–7.0)
Neutrophils: 46 %
Platelets: 206 10*3/uL (ref 150–450)
RBC: 3.84 x10E6/uL (ref 3.77–5.28)
RDW: 12.6 % (ref 11.7–15.4)
WBC: 3.9 10*3/uL (ref 3.4–10.8)

## 2021-12-11 LAB — CARDIOVASCULAR RISK ASSESSMENT

## 2021-12-11 LAB — MICROALBUMIN / CREATININE URINE RATIO
Creatinine, Urine: 65.2 mg/dL
Microalb/Creat Ratio: 14 mg/g creat (ref 0–29)
Microalbumin, Urine: 9.4 ug/mL

## 2021-12-11 LAB — HEMOGLOBIN A1C
Est. average glucose Bld gHb Est-mCnc: 134 mg/dL
Hgb A1c MFr Bld: 6.3 % — ABNORMAL HIGH (ref 4.8–5.6)

## 2021-12-13 NOTE — Progress Notes (Signed)
Blood count normal.  Liver function normal.  Kidney function abnormal. Function has waxed and waned over the last couple of years. Recommend no nsaids. Hydrate. Ask if patient picked up her new rx of irbesartan/hydrochlorothiazide. If not, I would consider adjusting medicine and taking hydrochlorothiazide out of combination of medicine.  Cholesterol: trigs little up. Recommend  cahnge otc fish oil to rx fish oil (vascepa or lovaza) HBA1C: 6.3. stable.  Not spilling protein in urine.

## 2022-01-01 DIAGNOSIS — K5790 Diverticulosis of intestine, part unspecified, without perforation or abscess without bleeding: Secondary | ICD-10-CM | POA: Diagnosis not present

## 2022-01-01 DIAGNOSIS — Z85831 Personal history of malignant neoplasm of soft tissue: Secondary | ICD-10-CM | POA: Diagnosis not present

## 2022-01-01 DIAGNOSIS — C787 Secondary malignant neoplasm of liver and intrahepatic bile duct: Secondary | ICD-10-CM | POA: Diagnosis not present

## 2022-01-01 DIAGNOSIS — Z08 Encounter for follow-up examination after completed treatment for malignant neoplasm: Secondary | ICD-10-CM | POA: Diagnosis not present

## 2022-01-01 DIAGNOSIS — C480 Malignant neoplasm of retroperitoneum: Secondary | ICD-10-CM | POA: Diagnosis not present

## 2022-01-01 DIAGNOSIS — R599 Enlarged lymph nodes, unspecified: Secondary | ICD-10-CM | POA: Diagnosis not present

## 2022-01-07 ENCOUNTER — Other Ambulatory Visit: Payer: Self-pay | Admitting: Family Medicine

## 2022-01-07 DIAGNOSIS — E119 Type 2 diabetes mellitus without complications: Secondary | ICD-10-CM | POA: Diagnosis not present

## 2022-01-07 DIAGNOSIS — H524 Presbyopia: Secondary | ICD-10-CM | POA: Diagnosis not present

## 2022-01-15 ENCOUNTER — Telehealth: Payer: Self-pay

## 2022-01-15 NOTE — Progress Notes (Signed)
? ? ?  Chronic Care Management ?Pharmacy Assistant  ? ?Name: Katherine Richmond  MRN: [id number] DOB: 02-09-1941 ? ?Reason for Encounter: Disease State call for DM  ? ?Recent office visits:  ?12/10/21 Rochel Brome MD. Seen for CKD stage 3, DM, HTN and HLD. Increased Irbesartan-HCTZ 300-12.'5mg'$ .  ? ?Recent consult visits:  ?01/01/22 (General Surgery) Jyl Heinz MD. Seen for Liver Metastases. No med changes. ? ?Hospital visits:  ?None ? ?Medications: ?Outpatient Encounter Medications as of 01/15/2022  ?Medication Sig  ? aspirin 81 MG EC tablet Take 81 mg by mouth daily.  ? atorvastatin (LIPITOR) 40 MG tablet Take 1 tablet (40 mg total) by mouth daily.  ? estradiol (ESTRACE) 1 MG tablet Take 1 tablet by mouth daily.  ? ferrous sulfate 325 (65 FE) MG tablet Take by mouth.  ? fluticasone (FLONASE) 50 MCG/ACT nasal spray Place 2 sprays into both nostrils daily.  ? gabapentin (NEURONTIN) 300 MG capsule Take 1 capsule (300 mg total) by mouth 2 (two) times daily.  ? irbesartan-hydrochlorothiazide (AVALIDE) 300-12.5 MG tablet Take 1 tablet by mouth daily.  ? levothyroxine (SYNTHROID) 50 MCG tablet TAKE ONE TABLET BY MOUTH DAILY  ? metFORMIN (GLUCOPHAGE) 1000 MG tablet Take 1 tablet (1,000 mg total) by mouth daily.  ? metoprolol succinate (TOPROL-XL) 100 MG 24 hr tablet Take 1 tablet (100 mg total) by mouth daily.  ? Multiple Vitamins-Minerals (MULTIPLE VITAMINS/WOMENS PO) Take by mouth.  ? Omega-3 Fatty Acids (OMEGA-3 FISH OIL) 1200 MG CAPS Take 1 capsule by mouth in the morning and at bedtime.  ? traZODone (DESYREL) 50 MG tablet Take 1 tablet (50 mg total) by mouth at bedtime.  ? ?No facility-administered encounter medications on file as of 01/15/2022.  ? ? ?Recent Relevant Labs: ?Lab Results  ?Component Value Date/Time  ? HGBA1C 6.3 (H) 12/10/2021 11:21 AM  ? HGBA1C 6.2 (H) 08/06/2021 01:33 PM  ? MICROALBUR 10 08/06/2021 12:02 PM  ? MICROALBUR 10 06/26/2020 09:50 AM  ?  ?Kidney Function ?Lab Results  ?Component Value Date/Time   ? CREATININE 1.44 (H) 12/10/2021 11:21 AM  ? CREATININE 1.1 10/01/2021 12:00 AM  ? CREATININE 1.27 (H) 08/06/2021 01:33 PM  ? GFRNONAA 35 (L) 06/26/2020 09:46 AM  ? GFRAA 40 (L) 06/26/2020 09:46 AM  ? ? ? ?Current antihyperglycemic regimen:  ?Metformin 1000 mg daily  ? ?Adherence Review: ?Is the patient currently on a STATIN medication? Yes ?Is the patient currently on ACE/ARB medication? Yes ?Does the patient have >5 day gap between last estimated fill dates? CPP to review ? ?Care Gaps: ?Last eye exam / Retinopathy Screening? Never done  ?Last Annual Wellness Visit? Done on 06/19/21 ?Last Diabetic Foot Exam? Done on 01/23/21 ?  ? ?Star Rating Drugs:  ?Medication:  Last Fill: Day Supply ?Metformin                 11/26/21  90ds ?          07/30/21             90ds ?  ? ?Elray Mcgregor, CMA ?Clinical Pharmacist Assistant  ?[phone] ? ?Unable to reach pt to complete this call  ?

## 2022-01-21 DIAGNOSIS — E663 Overweight: Secondary | ICD-10-CM | POA: Diagnosis not present

## 2022-01-21 DIAGNOSIS — E1142 Type 2 diabetes mellitus with diabetic polyneuropathy: Secondary | ICD-10-CM | POA: Diagnosis not present

## 2022-01-21 DIAGNOSIS — G8929 Other chronic pain: Secondary | ICD-10-CM | POA: Diagnosis not present

## 2022-01-21 DIAGNOSIS — G4700 Insomnia, unspecified: Secondary | ICD-10-CM | POA: Diagnosis not present

## 2022-01-21 DIAGNOSIS — I10 Essential (primary) hypertension: Secondary | ICD-10-CM | POA: Diagnosis not present

## 2022-01-21 DIAGNOSIS — Z6828 Body mass index (BMI) 28.0-28.9, adult: Secondary | ICD-10-CM | POA: Diagnosis not present

## 2022-01-21 DIAGNOSIS — E039 Hypothyroidism, unspecified: Secondary | ICD-10-CM | POA: Diagnosis not present

## 2022-01-21 DIAGNOSIS — J302 Other seasonal allergic rhinitis: Secondary | ICD-10-CM | POA: Diagnosis not present

## 2022-01-21 DIAGNOSIS — E785 Hyperlipidemia, unspecified: Secondary | ICD-10-CM | POA: Diagnosis not present

## 2022-02-09 ENCOUNTER — Other Ambulatory Visit: Payer: Self-pay | Admitting: Family Medicine

## 2022-02-18 DIAGNOSIS — K649 Unspecified hemorrhoids: Secondary | ICD-10-CM | POA: Diagnosis not present

## 2022-03-09 ENCOUNTER — Other Ambulatory Visit: Payer: Self-pay | Admitting: Physician Assistant

## 2022-03-11 ENCOUNTER — Other Ambulatory Visit: Payer: Self-pay | Admitting: Family Medicine

## 2022-03-15 ENCOUNTER — Telehealth: Payer: Self-pay

## 2022-03-15 NOTE — Progress Notes (Signed)
    Chronic Care Management Pharmacy Assistant   Name: Lanah Steines  MRN: [id number] DOB: [birth year]   Reason for Encounter: Disease State call for DM    Recent office visits:  None  Recent consult visits:  None  Hospital visits:  None  Medications: Outpatient Encounter Medications as of 03/15/2022  Medication Sig   aspirin 81 MG EC tablet Take 81 mg by mouth daily.   atorvastatin (LIPITOR) 40 MG tablet Take 1 tablet (40 mg total) by mouth daily.   estradiol (ESTRACE) 1 MG tablet Take 1 tablet by mouth daily.   ferrous sulfate 325 (65 FE) MG tablet Take by mouth.   fluticasone (FLONASE) 50 MCG/ACT nasal spray Place 2 sprays into both nostrils daily.   gabapentin (NEURONTIN) 300 MG capsule Take 1 capsule (300 mg total) by mouth 2 (two) times daily.   irbesartan-hydrochlorothiazide (AVALIDE) 300-12.5 MG tablet Take 1 tablet by mouth daily.   levothyroxine (SYNTHROID) 50 MCG tablet TAKE ONE TABLET BY MOUTH DAILY   metFORMIN (GLUCOPHAGE) 1000 MG tablet Take 1 tablet (1,000 mg total) by mouth daily.   metoprolol succinate (TOPROL-XL) 100 MG 24 hr tablet Take 1 tablet (100 mg total) by mouth daily.   Multiple Vitamins-Minerals (MULTIPLE VITAMINS/WOMENS PO) Take by mouth.   Omega-3 Fatty Acids (OMEGA-3 FISH OIL) 1200 MG CAPS Take 1 capsule by mouth in the morning and at bedtime.   traZODone (DESYREL) 50 MG tablet Take 1 tablet (50 mg total) by mouth at bedtime.   No facility-administered encounter medications on file as of 03/15/2022.    Recent Relevant Labs: Lab Results  Component Value Date/Time   HGBA1C 6.3 (H) 12/10/2021 11:21 AM   HGBA1C 6.2 (H) 08/06/2021 01:33 PM   MICROALBUR 10 08/06/2021 12:02 PM   MICROALBUR 10 06/26/2020 09:50 AM    Kidney Function Lab Results  Component Value Date/Time   CREATININE 1.44 (H) 12/10/2021 11:21 AM   CREATININE 1.1 10/01/2021 12:00 AM   CREATININE 1.27 (H) 08/06/2021 01:33 PM   GFRNONAA 35 (L) 06/26/2020 09:46 AM   GFRAA 40  (L) 06/26/2020 09:46 AM     Current antihyperglycemic regimen:  Metformin '1000mg'$   daily  Patient verbally confirms she is taking the above medications as directed. Yes  What recent interventions/DTPs have been made to improve glycemic control:  No changes   Have there been any recent hospitalizations or ED visits since last visit with CPP? No  Patient denies hypoglycemic symptoms, including None  Patient denies hyperglycemic symptoms, including none  How often are you checking your blood sugar? Pt was told her sugars are so well managed that she does not have to check her sugars at home. She does not have a glucometer of her own at home but feels great.   On insulin? No  During the week, how often does your blood glucose drop below 70? Never  Are you checking your feet daily/regularly? Yes  Adherence Review: Is the patient currently on a STATIN medication? Yes Is the patient currently on ACE/ARB medication? Yes Does the patient have >5 day gap between last estimated fill dates? CPP to review  Care Gaps: Last eye exam / Retinopathy Screening? Never done  Last Annual Wellness Visit? Done on 06/19/21 Last Diabetic Foot Exam? Done on 01/23/21    Star Rating Drugs:  Medication:  Last Fill: Day Supply Metformin   02/11/22 90ds    11/26/21 90ds   Elray Mcgregor, [facility] Pharmacist Assistant  [phone]

## 2022-03-22 DIAGNOSIS — K644 Residual hemorrhoidal skin tags: Secondary | ICD-10-CM | POA: Diagnosis not present

## 2022-03-22 DIAGNOSIS — K648 Other hemorrhoids: Secondary | ICD-10-CM | POA: Diagnosis not present

## 2022-03-22 DIAGNOSIS — I10 Essential (primary) hypertension: Secondary | ICD-10-CM | POA: Diagnosis not present

## 2022-03-22 DIAGNOSIS — E119 Type 2 diabetes mellitus without complications: Secondary | ICD-10-CM | POA: Diagnosis not present

## 2022-03-22 DIAGNOSIS — Z1211 Encounter for screening for malignant neoplasm of colon: Secondary | ICD-10-CM | POA: Diagnosis not present

## 2022-03-22 DIAGNOSIS — Z09 Encounter for follow-up examination after completed treatment for conditions other than malignant neoplasm: Secondary | ICD-10-CM | POA: Diagnosis not present

## 2022-03-22 DIAGNOSIS — Z86010 Personal history of colonic polyps: Secondary | ICD-10-CM | POA: Diagnosis not present

## 2022-03-22 LAB — HM COLONOSCOPY

## 2022-04-01 NOTE — Progress Notes (Signed)
[facility]  [address] [place],  [place]  [address] [phone]  Clinic Day:  04/02/2022  Referring physician: Rochel Brome, MD  HISTORY OF PRESENT ILLNESS:  The patient is a 81 y.o. 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 She comes in today for routine follow-up.  Since her last visit, the patient has been doing well. She denies having increased fatigue or any overt forms of blood loss which concern her for progressive anemia.    PHYSICAL EXAM:  Blood pressure (!) 174/85, pulse (!) 58, temperature 97.9 F (36.6 C), resp. rate 14, height '5\' 6"'$  (1.676 m), weight 175 lb 8 oz (79.6 kg), SpO2 96 %. Wt Readings from Last 3 Encounters:  04/02/22 175 lb 8 oz (79.6 kg)  12/10/21 183 lb 9.6 oz (83.3 kg)  10/01/21 181 lb 8 oz (82.3 kg)   Body mass index is 28.33 kg/m. Performance status (ECOG): 0 - Asymptomatic Physical Exam Constitutional:      Appearance: Normal appearance. She is not ill-appearing.  HENT:     Mouth/Throat:     Mouth: Mucous membranes are moist.     Pharynx: Oropharynx is clear. No oropharyngeal exudate or posterior oropharyngeal erythema.  Cardiovascular:     Rate and Rhythm: Normal rate and regular rhythm.     Heart sounds: No murmur heard.   No friction rub. No gallop.  Pulmonary:     Effort: Pulmonary effort is normal. No respiratory distress.     Breath sounds: Normal breath sounds. No wheezing, rhonchi or rales.  Abdominal:     General: Bowel sounds are normal. There is no distension.     Palpations: Abdomen is soft. There is no mass.     Tenderness: There is no abdominal tenderness.  Musculoskeletal:        General: No swelling.     Right  lower leg: No edema.     Left lower leg: No edema.  Lymphadenopathy:     Cervical: No cervical adenopathy.     Upper Body:     Right upper body: No supraclavicular or axillary adenopathy.     Left upper body: No supraclavicular or axillary adenopathy.     Lower Body: No right inguinal adenopathy. No left inguinal adenopathy.  Skin:    General: Skin is warm.     Coloration: Skin is not jaundiced.     Findings: No lesion or rash.  Neurological:     General: No focal deficit present.     Mental Status: She is alert and oriented to person, place, and time. Mental status is at baseline.  Psychiatric:        Mood and Affect: Mood normal.        Behavior: Behavior normal.        Thought Content: Thought content normal.    LABS:    Latest Reference Range & Units 04/02/22 00:00  WBC  3.9 (E)  RBC 3.87 - 5.11  4.12 (E)  Hemoglobin 12.0 - 16.0  11.4 ! (E)  HCT 36 - 46  35 ! (E)  Platelets 150 - 400 K/uL 238 (E)  NEUT#  1.95 (E)  !: Data is abnormal (E): External lab result   Latest Reference  Range & Units 04/02/22 09:26  Iron 28 - 170 ug/dL 84  UIBC ug/dL 266  TIBC 250 - 450 ug/dL 350  Saturation Ratios 10.4 - 31.8 % 24  Ferritin 11 - 307 ng/mL 55    Latest Reference Range & Units 04/02/22 00:00  Sodium 137 - 147  139 (E)  Potassium 3.5 - 5.1 mEq/L 3.7 (E)  Chloride 99 - 108  101 (E)  CO2 13 - 22  28 ! (E)  Glucose  113 (E)  BUN 4 - 21  13 (E)  Creatinine 0.5 - 1.1  1.2 ! (E)  Calcium 8.7 - 10.7  9.2 (E)  Alkaline Phosphatase 25 - 125  95 (E)  Albumin 3.5 - 5.0  4.5 (E)  AST 13 - 35  31 (E)  ALT 7 - 35 U/L 25 (E)  Bilirubin, Total  0.7 (E)  !: Data is abnormal (E): External lab result  ASSESSMENT & PLAN:  Assessment/Plan:  A 81 y.o. female with a history of mild anemia secondary to iron deficiency and mild renal insufficiency.  When evaluating her labs today, I am pleased with her hemoglobin of 11.4.  Her iron parameters also remain normal.  Her renal function is only  minimally off, which is likely related to her previous right nephrectomy.  As her hemoglobin remains well above 10, she will continue to be followed conservatively.  I will see her back in 6 months for repeat clinical assessment.  The patient understands all the plans discussed today and is in agreement with them.      Raden Byington Macarthur Critchley, MD

## 2022-04-02 ENCOUNTER — Inpatient Hospital Stay: Payer: Medicare PPO

## 2022-04-02 ENCOUNTER — Inpatient Hospital Stay: Payer: Medicare PPO | Attending: Oncology | Admitting: Oncology

## 2022-04-02 ENCOUNTER — Other Ambulatory Visit: Payer: Self-pay | Admitting: Oncology

## 2022-04-02 DIAGNOSIS — N189 Chronic kidney disease, unspecified: Secondary | ICD-10-CM

## 2022-04-02 DIAGNOSIS — D631 Anemia in chronic kidney disease: Secondary | ICD-10-CM

## 2022-04-02 DIAGNOSIS — N182 Chronic kidney disease, stage 2 (mild): Secondary | ICD-10-CM | POA: Diagnosis not present

## 2022-04-02 DIAGNOSIS — D509 Iron deficiency anemia, unspecified: Secondary | ICD-10-CM | POA: Diagnosis not present

## 2022-04-02 DIAGNOSIS — Z905 Acquired absence of kidney: Secondary | ICD-10-CM | POA: Insufficient documentation

## 2022-04-02 DIAGNOSIS — D649 Anemia, unspecified: Secondary | ICD-10-CM | POA: Diagnosis not present

## 2022-04-02 DIAGNOSIS — C480 Malignant neoplasm of retroperitoneum: Secondary | ICD-10-CM | POA: Diagnosis not present

## 2022-04-02 LAB — HEPATIC FUNCTION PANEL
ALT: 25 U/L (ref 7–35)
AST: 31 (ref 13–35)
Alkaline Phosphatase: 95 (ref 25–125)
Bilirubin, Total: 0.7

## 2022-04-02 LAB — BASIC METABOLIC PANEL
BUN: 13 (ref 4–21)
CO2: 28 — AB (ref 13–22)
Chloride: 101 (ref 99–108)
Creatinine: 1.2 — AB (ref 0.5–1.1)
Glucose: 113
Potassium: 3.7 mEq/L (ref 3.5–5.1)
Sodium: 139 (ref 137–147)

## 2022-04-02 LAB — CBC AND DIFFERENTIAL
HCT: 35 — AB (ref 36–46)
Hemoglobin: 11.4 — AB (ref 12.0–16.0)
Neutrophils Absolute: 1.95
Platelets: 238 10*3/uL (ref 150–400)
WBC: 3.9

## 2022-04-02 LAB — IRON AND TIBC
Iron: 84 ug/dL (ref 28–170)
Saturation Ratios: 24 % (ref 10.4–31.8)
TIBC: 350 ug/dL (ref 250–450)
UIBC: 266 ug/dL

## 2022-04-02 LAB — FERRITIN: Ferritin: 55 ng/mL (ref 11–307)

## 2022-04-02 LAB — COMPREHENSIVE METABOLIC PANEL
Albumin: 4.5 (ref 3.5–5.0)
Calcium: 9.2 (ref 8.7–10.7)

## 2022-04-02 LAB — CBC: RBC: 4.12 (ref 3.87–5.11)

## 2022-04-07 ENCOUNTER — Other Ambulatory Visit: Payer: Self-pay | Admitting: Family Medicine

## 2022-04-11 ENCOUNTER — Encounter: Payer: Self-pay | Admitting: Family Medicine

## 2022-04-13 DIAGNOSIS — I129 Hypertensive chronic kidney disease with stage 1 through stage 4 chronic kidney disease, or unspecified chronic kidney disease: Secondary | ICD-10-CM | POA: Insufficient documentation

## 2022-04-13 DIAGNOSIS — N1831 Chronic kidney disease, stage 3a: Secondary | ICD-10-CM | POA: Insufficient documentation

## 2022-04-13 NOTE — Progress Notes (Signed)
Subjective:  Patient ID: [place], female    DOB: May 29, 1941  Age: 81 y.o. MRN: [id number]  Chief Complaint  Patient presents with   Diabetes   Hypertension   Hypothyroidism    HPI Diabetes:  Complications: Hyperlipidemia, Hypertension. Glucose checking:not checking. Glucose logs: Hypoglycemia: Most recent A1C: 6.3 % Current medications: Metformin 1000 mg daily. Last Eye Exam: Dr. Kenton [facility]) recently.  Foot checks:daily  Hyperlipidemia: Current medications: Atorvastatin 40 mg daily, Omega-3 1000 mg twice a day. Eating healthy. exercising  Hypertension: Complications: Diabetes, Hypertension. Current medications: Irbesartan-Hctz 300-12.5 mg daily, Metoprolol 100 mg daily, Aspirin 81 mg daily.  Hypothyroidism: She is taking Levothyroxine 50 mcg daily.  Leimyosarcoma: managed by Dr. Bobby Rumpf and Dr. Crisoforo Oxford.   HPI reviewed and updated.  Current Outpatient Medications on File Prior to Visit  Medication Sig Dispense Refill   aspirin 81 MG EC tablet Take 81 mg by mouth daily.     atorvastatin (LIPITOR) 40 MG tablet Take 1 tablet (40 mg total) by mouth daily. 90 tablet 1   estradiol (ESTRACE) 1 MG tablet Take 1 tablet by mouth daily.     ferrous sulfate 325 (65 FE) MG tablet Take by mouth.     fluticasone (FLONASE) 50 MCG/ACT nasal spray Place 2 sprays into both nostrils daily. 48 g 1   gabapentin (NEURONTIN) 300 MG capsule Take 1 capsule (300 mg total) by mouth 2 (two) times daily. 180 capsule 1   irbesartan-hydrochlorothiazide (AVALIDE) 300-12.5 MG tablet Take 1 tablet by mouth daily. 90 tablet 1   levothyroxine (SYNTHROID) 50 MCG tablet TAKE ONE TABLET BY MOUTH DAILY 90 tablet 1   metFORMIN (GLUCOPHAGE) 1000 MG tablet Take 1 tablet (1,000 mg total) by mouth daily. 90 tablet 1   metoprolol succinate (TOPROL-XL) 100 MG 24 hr tablet Take 1 tablet (100 mg total) by mouth daily. 90 tablet 1   Multiple Vitamins-Minerals (MULTIPLE VITAMINS/WOMENS PO) Take by  mouth.     Omega-3 Fatty Acids (OMEGA-3 FISH OIL) 1200 MG CAPS Take 1 capsule by mouth in the morning and at bedtime.     traZODone (DESYREL) 50 MG tablet Take 1 tablet (50 mg total) by mouth at bedtime. 90 tablet 1   No current facility-administered medications on file prior to visit.   Past Medical History:  Diagnosis Date   Anemia    Cancer ([place]) 07/2018   Leiomyosarcoma   Chronic kidney disease    N18.3a).   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   TONSILLECTOMY  2002    Family History  Problem Relation Age of Onset   Breast cancer Mother    Colon cancer Mother    Renal cancer Mother    Liver cancer Half-Brother    Multiple sclerosis Half-Brother    Hypertension Other    Transient ischemic attack Other    Glaucoma Other    Social History   Socioeconomic History   Marital status: Married    Spouse name: Iona Beard  Bagg   Number of children: 1   Years of education: Not on file   Highest education level: Not on file  Occupational History   Occupation: RETIRED   Occupation:  [employer], [employer]  Tobacco Use   Smoking status: Former    Types: Cigarettes    Quit date: 2012    Years since quitting: 11.4   Smokeless tobacco: Never  Substance and Sexual Activity   Alcohol use: Yes    Alcohol/week: 1.0 standard drink of alcohol    Types: 1 Glasses of wine per week    Comment: SOCIAL USE ONLY   Drug use: Never   Sexual activity: Not on file  Other Topics Concern   Not  on file  Social History Narrative   Not on file   Social Determinants of Health   Financial Resource Strain: Not on file  Food Insecurity: No Food Insecurity (03/19/2021)   Hunger Vital Sign    Worried About Running Out of Food in the Last Year: Never true    Ran Out of Food in the Last Year: Never true  Transportation Needs: No Transportation Needs (03/19/2021)   PRAPARE - Hydrologist (Medical): No    Lack of Transportation (Non-Medical): No  Physical Activity: Insufficiently Active (03/19/2021)   Exercise Vital Sign    Days of Exercise per Week: 2 days    Minutes of Exercise per Session: 30 min  Stress: Not on file  Social Connections: Not on file    Review of Systems  Constitutional:  Negative for chills, fatigue and fever.  HENT:  Negative for congestion, ear pain and sore throat.   Respiratory:  Negative for cough and shortness of breath.   Cardiovascular:  Negative for chest pain and palpitations.  Gastrointestinal:  Negative for abdominal pain, constipation, diarrhea, nausea and vomiting.  Endocrine: Negative for polydipsia, polyphagia and polyuria.  Genitourinary:  Negative for difficulty urinating and dysuria.  Musculoskeletal:  Positive for back pain (Right side- from lifting husband) and myalgias. Negative for arthralgias.  Skin:  Negative for rash.  Neurological:  Negative for headaches.  Psychiatric/Behavioral:  Negative for dysphoric mood. The patient is not nervous/anxious.      Objective:  BP 118/72   Pulse 70   Temp 98 F (36.7 C)   Resp 18   Ht '5\' 6"'$  (1.676 m)   Wt 175 lb 3.2 oz (79.5 kg)   SpO2 99%   BMI 28.28 kg/m      04/15/2022    9:18 AM 04/02/2022    9:36 AM 12/10/2021   10:27 AM  BP/Weight  Systolic BP 376 283 151  Diastolic BP 72 85 74  Wt. (Lbs) 175.2 175.5   BMI 28.28 kg/m2 28.33 kg/m2     Physical Exam Vitals reviewed.  Constitutional:      Appearance: Normal appearance. She is normal weight.  HENT:      Right Ear: Tympanic membrane normal.     Left Ear: Tympanic membrane normal.     Nose: Nose normal.  Cardiovascular:     Rate and Rhythm: Normal rate and regular rhythm.     Pulses: Normal pulses.     Heart sounds: Normal heart sounds.  Pulmonary:     Effort: Pulmonary effort is normal.     Breath sounds: Normal breath sounds.  Abdominal:     General: Bowel sounds are normal.  Neurological:     Mental Status: She is oriented to person, place,  and time. Mental status is at baseline.  Psychiatric:        Mood and Affect: Mood normal.        Behavior: Behavior normal.     Diabetic Foot Exam - Simple   No data filed      Lab Results  Component Value Date   WBC 4.2 04/15/2022   HGB 10.8 (L) 04/15/2022   HCT 32.4 (L) 04/15/2022   PLT 210 04/15/2022   GLUCOSE 107 (H) 04/15/2022   CHOL 173 04/15/2022   TRIG 133 04/15/2022   HDL 62 04/15/2022   LDLCALC 88 04/15/2022   ALT 17 04/15/2022   AST 20 04/15/2022   NA 138 04/15/2022   K 4.4 04/15/2022   CL 99 04/15/2022   CREATININE 1.40 (H) 04/15/2022   BUN 17 04/15/2022   CO2 23 04/15/2022   TSH 1.320 04/15/2022   HGBA1C 6.2 (H) 04/15/2022   MICROALBUR 10 08/06/2021      Assessment & Plan:   Problem List Items Addressed This Visit       Cardiovascular and Mediastinum   Hypertension complicating diabetes ([place]) - Primary    The current medical regimen is effective;  continue present plan and medications. Continue Irbesartan-Hctz 300-12.5 mg daily, Metoprolol 100 mg daily, Aspirin 81 mg daily.      Relevant Orders   Comprehensive metabolic panel (Completed)   CBC with Differential/Platelet (Completed)     Endocrine   Diabetic glomerulopathy ([place])    Control: good Recommend check feet daily. Recommend annual eye exams. Medicines: Metformin 1000 mg daily. Continue to work on eating a healthy diet and exercise.  Labs drawn today.         Relevant Orders   Hemoglobin A1c (Completed)   Secondary  hypothyroidism    The current medical regimen is effective;  continue present plan and medications. Continue Levothyroxine 50 mcg daily.      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  No changes to medicines. Continue Irbesartan-Hctz 300-12.5 mg daily, Metoprolol 100 mg daily, Aspirin 81 mg daily. Continue to work on eating a healthy diet and exercise.  Labs drawn today.          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        Hyperlipidemia    Well controlled.  No changes to medicines. Continue Atorvastatin 40 mg daily, Omega-3 1000 mg twice a day. Continue to work on eating a healthy diet and exercise.  Labs drawn today.        Relevant Orders   Lipid panel (Completed)  .  Orders Placed This Encounter  Procedures   Comprehensive metabolic panel   Lipid panel   TSH   CBC with Differential/Platelet   Hemoglobin A1c   Cardiovascular Risk Assessment     Follow-up: Return in about 4 months (around 08/15/2022) for  chronic fasting, awv early september. .  An After Visit Summary was printed and given to the patient.  Rochel Brome, MD [facility] [phone]

## 2022-04-15 ENCOUNTER — Ambulatory Visit: Payer: Medicare PPO | Admitting: Family Medicine

## 2022-04-15 VITALS — BP 118/72 | HR 70 | Temp 98.0°F | Resp 18 | Ht 66.0 in | Wt 175.2 lb

## 2022-04-15 DIAGNOSIS — E1121 Type 2 diabetes mellitus with diabetic nephropathy: Secondary | ICD-10-CM | POA: Diagnosis not present

## 2022-04-15 DIAGNOSIS — I129 Hypertensive chronic kidney disease with stage 1 through stage 4 chronic kidney disease, or unspecified chronic kidney disease: Secondary | ICD-10-CM

## 2022-04-15 DIAGNOSIS — E1159 Type 2 diabetes mellitus with other circulatory complications: Secondary | ICD-10-CM | POA: Diagnosis not present

## 2022-04-15 DIAGNOSIS — E038 Other specified hypothyroidism: Secondary | ICD-10-CM

## 2022-04-15 DIAGNOSIS — E782 Mixed hyperlipidemia: Secondary | ICD-10-CM | POA: Diagnosis not present

## 2022-04-15 DIAGNOSIS — D696 Thrombocytopenia, unspecified: Secondary | ICD-10-CM | POA: Diagnosis not present

## 2022-04-15 DIAGNOSIS — C480 Malignant neoplasm of retroperitoneum: Secondary | ICD-10-CM

## 2022-04-15 DIAGNOSIS — I152 Hypertension secondary to endocrine disorders: Secondary | ICD-10-CM | POA: Diagnosis not present

## 2022-04-15 DIAGNOSIS — N1831 Chronic kidney disease, stage 3a: Secondary | ICD-10-CM | POA: Diagnosis not present

## 2022-04-16 LAB — COMPREHENSIVE METABOLIC PANEL
ALT: 17 IU/L (ref 0–32)
AST: 20 IU/L (ref 0–40)
Albumin/Globulin Ratio: 1.9 (ref 1.2–2.2)
Albumin: 4.4 g/dL (ref 3.7–4.7)
Alkaline Phosphatase: 88 IU/L (ref 44–121)
BUN/Creatinine Ratio: 12 (ref 12–28)
BUN: 17 mg/dL (ref 8–27)
Bilirubin Total: 0.3 mg/dL (ref 0.0–1.2)
CO2: 23 mmol/L (ref 20–29)
Calcium: 9.9 mg/dL (ref 8.7–10.3)
Chloride: 99 mmol/L (ref 96–106)
Creatinine, Ser: 1.4 mg/dL — ABNORMAL HIGH (ref 0.57–1.00)
Globulin, Total: 2.3 g/dL (ref 1.5–4.5)
Glucose: 107 mg/dL — ABNORMAL HIGH (ref 70–99)
Potassium: 4.4 mmol/L (ref 3.5–5.2)
Sodium: 138 mmol/L (ref 134–144)
Total Protein: 6.7 g/dL (ref 6.0–8.5)
eGFR: 39 mL/min/{1.73_m2} — ABNORMAL LOW (ref >59)

## 2022-04-16 LAB — CBC WITH DIFFERENTIAL/PLATELET
Basophils Absolute: 0 10*3/uL (ref 0.0–0.2)
Basos: 1 %
EOS (ABSOLUTE): 0.1 10*3/uL (ref 0.0–0.4)
Eos: 3 %
Hematocrit: 32.4 % — ABNORMAL LOW (ref 34.0–46.6)
Hemoglobin: 10.8 g/dL — ABNORMAL LOW (ref 11.1–15.9)
Immature Grans (Abs): 0 10*3/uL (ref 0.0–0.1)
Immature Granulocytes: 0 %
Lymphocytes Absolute: 1.6 10*3/uL (ref 0.7–3.1)
Lymphs: 37 %
MCH: 27.8 pg (ref 26.6–33.0)
MCHC: 33.3 g/dL (ref 31.5–35.7)
MCV: 83 fL (ref 79–97)
Monocytes Absolute: 0.5 10*3/uL (ref 0.1–0.9)
Monocytes: 11 %
Neutrophils Absolute: 2 10*3/uL (ref 1.4–7.0)
Neutrophils: 48 %
Platelets: 210 10*3/uL (ref 150–450)
RBC: 3.89 x10E6/uL (ref 3.77–5.28)
RDW: 12.7 % (ref 11.7–15.4)
WBC: 4.2 10*3/uL (ref 3.4–10.8)

## 2022-04-16 LAB — LIPID PANEL
Chol/HDL Ratio: 2.8 ratio (ref 0.0–4.4)
Cholesterol, Total: 173 mg/dL (ref 100–199)
HDL: 62 mg/dL (ref >39)
LDL Chol Calc (NIH): 88 mg/dL (ref 0–99)
Triglycerides: 133 mg/dL (ref 0–149)
VLDL Cholesterol Cal: 23 mg/dL (ref 5–40)

## 2022-04-16 LAB — HEMOGLOBIN A1C
Est. average glucose Bld gHb Est-mCnc: 131 mg/dL
Hgb A1c MFr Bld: 6.2 % — ABNORMAL HIGH (ref 4.8–5.6)

## 2022-04-16 LAB — TSH: TSH: 1.32 u[IU]/mL (ref 0.450–4.500)

## 2022-04-16 NOTE — Progress Notes (Signed)
Blood count abnormal. Anemia little worse. Liver function normal.  Kidney function abnormal. Stable.  Thyroid function normal.  Cholesterol: good. HBA1C: 6.2.

## 2022-04-20 ENCOUNTER — Encounter: Payer: Self-pay | Admitting: Family Medicine

## 2022-04-20 DIAGNOSIS — D696 Thrombocytopenia, unspecified: Secondary | ICD-10-CM | POA: Insufficient documentation

## 2022-04-20 NOTE — Assessment & Plan Note (Signed)
Well controlled.  No changes to medicines. Continue Irbesartan-Hctz 300-12.5 mg daily, Metoprolol 100 mg daily, Aspirin 81 mg daily. Continue to work on eating a healthy diet and exercise.  Labs drawn today.   

## 2022-04-20 NOTE — Assessment & Plan Note (Signed)
The current medical regimen is effective;  continue present plan and medications. Continue Levothyroxine 50 mcg daily.

## 2022-04-20 NOTE — Assessment & Plan Note (Signed)
Control: good Recommend check feet daily. Recommend annual eye exams. Medicines: Metformin 1000 mg daily. Continue to work on eating a healthy diet and exercise.  Labs drawn today.

## 2022-04-20 NOTE — Assessment & Plan Note (Signed)
Management per specialist. 

## 2022-04-20 NOTE — Assessment & Plan Note (Signed)
Check cbc 

## 2022-04-20 NOTE — Assessment & Plan Note (Signed)
Well controlled.  No changes to medicines. Continue Atorvastatin 40 mg daily, Omega-3 1000 mg twice a day. Continue to work on eating a healthy diet and exercise.  Labs drawn today.

## 2022-04-20 NOTE — Assessment & Plan Note (Signed)
The current medical regimen is effective;  continue present plan and medications. Continue Irbesartan-Hctz 300-12.5 mg daily, Metoprolol 100 mg daily, Aspirin 81 mg daily.

## 2022-05-03 ENCOUNTER — Telehealth: Payer: Self-pay

## 2022-05-03 NOTE — Telephone Encounter (Signed)
Direct msg from [facility] that patient not interested in CCM services. Cancelled appt

## 2022-05-06 ENCOUNTER — Telehealth: Payer: Medicare PPO

## 2022-05-25 ENCOUNTER — Other Ambulatory Visit: Payer: Self-pay | Admitting: Nurse Practitioner

## 2022-06-09 ENCOUNTER — Other Ambulatory Visit: Payer: Self-pay | Admitting: Physician Assistant

## 2022-06-23 ENCOUNTER — Other Ambulatory Visit: Payer: Self-pay | Admitting: Family Medicine

## 2022-07-01 DIAGNOSIS — K769 Liver disease, unspecified: Secondary | ICD-10-CM | POA: Diagnosis not present

## 2022-07-01 DIAGNOSIS — Z9089 Acquired absence of other organs: Secondary | ICD-10-CM | POA: Diagnosis not present

## 2022-07-01 DIAGNOSIS — Z8589 Personal history of malignant neoplasm of other organs and systems: Secondary | ICD-10-CM | POA: Diagnosis not present

## 2022-07-01 DIAGNOSIS — Z905 Acquired absence of kidney: Secondary | ICD-10-CM | POA: Diagnosis not present

## 2022-07-01 DIAGNOSIS — Z9889 Other specified postprocedural states: Secondary | ICD-10-CM | POA: Diagnosis not present

## 2022-07-01 DIAGNOSIS — R918 Other nonspecific abnormal finding of lung field: Secondary | ICD-10-CM | POA: Diagnosis not present

## 2022-07-01 DIAGNOSIS — Z08 Encounter for follow-up examination after completed treatment for malignant neoplasm: Secondary | ICD-10-CM | POA: Diagnosis not present

## 2022-07-01 DIAGNOSIS — E896 Postprocedural adrenocortical (-medullary) hypofunction: Secondary | ICD-10-CM | POA: Diagnosis not present

## 2022-07-01 DIAGNOSIS — C787 Secondary malignant neoplasm of liver and intrahepatic bile duct: Secondary | ICD-10-CM | POA: Diagnosis not present

## 2022-07-01 DIAGNOSIS — Z85831 Personal history of malignant neoplasm of soft tissue: Secondary | ICD-10-CM | POA: Diagnosis not present

## 2022-07-02 DIAGNOSIS — R918 Other nonspecific abnormal finding of lung field: Secondary | ICD-10-CM | POA: Insufficient documentation

## 2022-07-11 DIAGNOSIS — C480 Malignant neoplasm of retroperitoneum: Secondary | ICD-10-CM | POA: Diagnosis not present

## 2022-07-11 DIAGNOSIS — R918 Other nonspecific abnormal finding of lung field: Secondary | ICD-10-CM | POA: Diagnosis not present

## 2022-07-30 DIAGNOSIS — C480 Malignant neoplasm of retroperitoneum: Secondary | ICD-10-CM | POA: Diagnosis not present

## 2022-07-30 DIAGNOSIS — C787 Secondary malignant neoplasm of liver and intrahepatic bile duct: Secondary | ICD-10-CM | POA: Diagnosis not present

## 2022-07-30 DIAGNOSIS — R918 Other nonspecific abnormal finding of lung field: Secondary | ICD-10-CM | POA: Diagnosis not present

## 2022-08-12 ENCOUNTER — Other Ambulatory Visit: Payer: Self-pay

## 2022-08-13 DIAGNOSIS — C480 Malignant neoplasm of retroperitoneum: Secondary | ICD-10-CM | POA: Diagnosis not present

## 2022-08-13 DIAGNOSIS — K7689 Other specified diseases of liver: Secondary | ICD-10-CM | POA: Diagnosis not present

## 2022-08-13 DIAGNOSIS — C787 Secondary malignant neoplasm of liver and intrahepatic bile duct: Secondary | ICD-10-CM | POA: Diagnosis not present

## 2022-08-20 ENCOUNTER — Ambulatory Visit: Payer: Medicare PPO | Admitting: Family Medicine

## 2022-08-20 VITALS — BP 118/62 | HR 72 | Temp 95.1°F | Resp 14 | Wt 179.6 lb

## 2022-08-20 DIAGNOSIS — E1121 Type 2 diabetes mellitus with diabetic nephropathy: Secondary | ICD-10-CM

## 2022-08-20 DIAGNOSIS — C480 Malignant neoplasm of retroperitoneum: Secondary | ICD-10-CM

## 2022-08-20 DIAGNOSIS — D696 Thrombocytopenia, unspecified: Secondary | ICD-10-CM

## 2022-08-20 DIAGNOSIS — E038 Other specified hypothyroidism: Secondary | ICD-10-CM

## 2022-08-20 DIAGNOSIS — I10 Essential (primary) hypertension: Secondary | ICD-10-CM

## 2022-08-20 DIAGNOSIS — N1831 Chronic kidney disease, stage 3a: Secondary | ICD-10-CM

## 2022-08-20 DIAGNOSIS — I152 Hypertension secondary to endocrine disorders: Secondary | ICD-10-CM

## 2022-08-20 DIAGNOSIS — I129 Hypertensive chronic kidney disease with stage 1 through stage 4 chronic kidney disease, or unspecified chronic kidney disease: Secondary | ICD-10-CM

## 2022-08-20 DIAGNOSIS — Z23 Encounter for immunization: Secondary | ICD-10-CM | POA: Diagnosis not present

## 2022-08-20 DIAGNOSIS — E1159 Type 2 diabetes mellitus with other circulatory complications: Secondary | ICD-10-CM | POA: Diagnosis not present

## 2022-08-20 MED ORDER — METOPROLOL SUCCINATE ER 100 MG PO TB24: 100.0000 mg | ORAL_TABLET | Freq: Every day | ORAL | 1 refills | Status: DC

## 2022-08-20 NOTE — Progress Notes (Signed)
Subjective:  Patient ID: Katherine Richmond, female    DOB: Feb 23, 1941  Age: 81 y.o. MRN: [id number]  Chief Complaint  Patient presents with   Diabetes    HPI  Diabetes:  Complications: Hyperlipidemia, Hypertension. Glucose checking:not checking. Most recent A1C: 6.2 % Current medications: Metformin 1000 mg daily. Last Eye Exam: Dr. Kenton [facility]) recently.  Foot checks:daily  Hyperlipidemia: Current medications: Atorvastatin 40 mg daily, Omega-3 1000 mg twice a day. Eating healthy. exercising  Hypertension: Complications: Diabetes, Hypertension. Current medications: Irbesartan-Hctz 300-12.5 mg daily, Metoprolol 100 mg daily, Aspirin 81 mg daily.  Hypothyroidism: She is taking Levothyroxine 50 mcg daily.  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 Results pending. Nodule in lungs but too small to biopsy. Have been there a long time, but may be a little bigger.  Patient is scheduled to see Dr. Bobby Rumpf.   Current Outpatient Medications on File Prior to Visit  Medication Sig Dispense Refill   aspirin 81 MG EC tablet Take 81 mg by mouth daily.     atorvastatin (LIPITOR) 40 MG tablet Take 1 tablet (40 mg total) by mouth daily. 90 tablet 0   estradiol (ESTRACE) 1 MG tablet Take 1 tablet by mouth daily.     ferrous sulfate 325 (65 FE) MG tablet Take by mouth.     fluticasone (FLONASE) 50 MCG/ACT nasal spray Place 2 sprays into both nostrils daily. 48 g 1   gabapentin (NEURONTIN) 300 MG capsule Take 1 capsule (300 mg total) by mouth 2 (two) times daily. 180 capsule 1   irbesartan-hydrochlorothiazide (AVALIDE) 300-12.5 MG tablet Take 1 tablet by mouth daily. 90 tablet 0   levothyroxine (SYNTHROID) 50 MCG tablet TAKE ONE TABLET BY MOUTH DAILY 90 tablet 1   Multiple Vitamins-Minerals (MULTIPLE VITAMINS/WOMENS PO) Take by mouth.     Omega-3 Fatty Acids (OMEGA-3 FISH OIL) 1200 MG CAPS Take 1 capsule by mouth in the morning and at bedtime.      traZODone (DESYREL) 50 MG tablet Take 1 tablet (50 mg total) by mouth at bedtime. 90 tablet 3   No current facility-administered medications on file prior to visit.   Past Medical History:  Diagnosis Date   Anemia    Cancer ([place]) 07/2018   Leiomyosarcoma   Chronic kidney disease    N18.3a).   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   TONSILLECTOMY  2002    Family History  Problem Relation Age of Onset   Breast cancer Mother    Colon cancer Mother    Renal cancer Mother    Liver cancer Half-Brother    Multiple sclerosis Half-Brother    Hypertension Other    Transient ischemic attack Other    Glaucoma Other    Social History   Socioeconomic History   Marital status: Married    Spouse name: Iona Beard Varghese   Number of children: 1   Years of education: Not on file   Highest education level: Not  on file  Occupational History   Occupation: RETIRED   Occupation: [employer] [place], [employer], [place]  Tobacco Use   Smoking status: Former    Types: Cigarettes    Quit date: 2012    Years since quitting: 11.8   Smokeless tobacco: Never  Substance and Sexual Activity   Alcohol use: Yes    Alcohol/week: 1.0 standard drink of alcohol    Types: 1 Glasses of wine per week    Comment: SOCIAL USE ONLY   Drug use: Never   Sexual activity: Not on file  Other Topics Concern   Not on file  Social History Narrative   Not on file   Social Determinants of Health   Financial Resource Strain: Not on  file  Food Insecurity: No Food Insecurity (03/19/2021)   Hunger Vital Sign    Worried About Running Out of Food in the Last Year: Never true    Ran Out of Food in the Last Year: Never true  Transportation Needs: No Transportation Needs (03/19/2021)   PRAPARE - Hydrologist (Medical): No    Lack of Transportation (Non-Medical): No  Physical Activity: Insufficiently Active (03/19/2021)   Exercise Vital Sign    Days of Exercise per Week: 2 days    Minutes of Exercise per Session: 30 min  Stress: Not on file  Social Connections: Not on file    Review of Systems  Constitutional:  Negative for chills, fatigue and fever.  HENT:  Negative for congestion, ear pain and sore throat.   Respiratory:  Negative for cough and shortness of breath.   Cardiovascular:  Negative for chest pain.  Gastrointestinal:  Negative for abdominal pain, constipation, diarrhea, nausea and vomiting.  Genitourinary:  Negative for dysuria and urgency.  Musculoskeletal:  Negative for arthralgias and myalgias.  Skin:  Negative for rash.  Neurological:  Negative for dizziness and headaches.  Psychiatric/Behavioral:  Negative for dysphoric mood. The patient is not nervous/anxious.      Objective:  BP 118/62   Pulse 72   Temp (!) 95.1 F (35.1 C)   Resp 14   Wt 179 lb 9.6 oz (81.5 kg)   BMI 28.99 kg/m      08/20/2022    8:35 PM 04/15/2022    9:18 AM 04/02/2022    9:36 AM  BP/Weight  Systolic BP 034 742 595  Diastolic BP 62 72 85  Wt. (Lbs) 179.6 175.2 175.5  BMI 28.99 kg/m2 28.28 kg/m2 28.33 kg/m2    Physical Exam Vitals reviewed.  Constitutional:      Appearance: Normal appearance. She is normal weight.  Neck:     Vascular: No carotid bruit.  Cardiovascular:     Rate and Rhythm: Normal rate and regular rhythm.     Heart sounds: Normal heart sounds.  Pulmonary:     Effort: Pulmonary effort is normal. No respiratory distress.     Breath sounds: Normal breath sounds.   Abdominal:     General: Abdomen is flat. Bowel sounds are normal.     Palpations: Abdomen is soft.     Tenderness: There is no abdominal tenderness.  Neurological:     Mental Status: She is alert and oriented to person, place, and time.  Psychiatric:        Mood and Affect: Mood normal.        Behavior: Behavior normal.     Diabetic Foot Exam - Simple   Simple Foot Form Diabetic Foot exam was performed  with the following findings: Yes 08/20/2022  9:35 AM  Visual Inspection See comments: Yes Sensation Testing Intact to touch and monofilament testing bilaterally: Yes Pulse Check Posterior Tibialis and Dorsalis pulse intact bilaterally: Yes Comments Calluses.      Lab Results  Component Value Date   WBC 3.9 08/20/2022   HGB 11.3 08/20/2022   HCT 34.1 08/20/2022   PLT 187 08/20/2022   GLUCOSE 107 (H) 08/20/2022   CHOL 180 08/20/2022   TRIG 186 (H) 08/20/2022   HDL 55 08/20/2022   LDLCALC 93 08/20/2022   ALT 19 08/20/2022   AST 22 08/20/2022   NA 139 08/20/2022   K 4.5 08/20/2022   CL 102 08/20/2022   CREATININE 1.49 (H) 08/20/2022   BUN 13 08/20/2022   CO2 24 08/20/2022   TSH 1.320 04/15/2022   HGBA1C 6.3 (H) 08/20/2022   MICROALBUR 10 08/06/2021      Assessment & Plan:   Problem List Items Addressed This Visit       Cardiovascular and Mediastinum   Hypertension complicating diabetes ([place])    Well controlled.  No changes to medicines. Continue Irbesartan-Hctz 300-12.5 mg daily, Metoprolol 100 mg daily, Aspirin 81 mg daily. Continue to work on eating a healthy diet and exercise.  Labs drawn today.        Relevant Medications   metoprolol succinate (TOPROL-XL) 100 MG 24 hr tablet     Endocrine   Diabetic glomerulopathy (HCC) - Primary    Control: good Recommend check feet daily. Recommend annual eye exams. Medicines: Continue Metformin 1000 mg daily. Continue to work on eating a healthy diet and exercise.  Labs drawn today.         Relevant  Orders   CBC with Differential/Platelet (Completed)   Comprehensive metabolic panel (Completed)   Hemoglobin A1c (Completed)   Lipid panel (Completed)   Cardiovascular Risk Assessment (Completed)   Secondary hypothyroidism    Previously well controlled Continue Synthroid at current dose  Recheck TSH and adjust Synthroid as indicated        Relevant Medications   metoprolol succinate (TOPROL-XL) 100 MG 24 hr tablet     Genitourinary   Stage 3a chronic kidney disease (HCC)    Stable. Check cmp.       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 Recommend continue to work on eating healthy diet and exercise. Check labs.       Relevant Medications   metoprolol succinate (TOPROL-XL) 100 MG 24 hr tablet     Hematopoietic and Hemostatic   Thrombocytopenia (HCC)    Check cbc.        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 Bobby Rumpf and Dr. Crisoforo Oxford.       Other Visit Diagnoses     Need for influenza vaccination       Relevant Orders   Flu Vaccine QUAD High Dose(Fluad) (Completed)   Need for COVID-19 vaccine       Relevant Orders   [employer] Fall 2023 Covid-19 Vaccine 24yr and older (Completed)     .  Meds ordered this encounter  Medications   metoprolol succinate (TOPROL-XL) 100 MG 24 hr tablet    Sig: Take 1 tablet (100 mg total) by mouth daily.    Dispense:  90 tablet    Refill:  1    This prescription was filled on  12/24/2021. Any refills authorized will be placed on file.    Orders Placed This Encounter  Procedures   [place] Fall 2023 Covid-19 Vaccine 47yr and older   Flu Vaccine QUAD High Dose(Fluad)   CBC with Differential/Platelet   Comprehensive metabolic panel   Hemoglobin A1c   Lipid panel   Cardiovascular Risk Assessment     Follow-up: Return in about 4 months (around 12/21/2022) for chronic  fasting.  An After Visit Summary was printed and given to the patient.  KRochel Brome MD [facility] ([phone]

## 2022-08-21 LAB — CBC WITH DIFFERENTIAL/PLATELET
Basophils Absolute: 0 10*3/uL (ref 0.0–0.2)
Basos: 1 %
EOS (ABSOLUTE): 0.1 10*3/uL (ref 0.0–0.4)
Eos: 3 %
Hematocrit: 34.1 % (ref 34.0–46.6)
Hemoglobin: 11.3 g/dL (ref 11.1–15.9)
Immature Grans (Abs): 0 10*3/uL (ref 0.0–0.1)
Immature Granulocytes: 0 %
Lymphocytes Absolute: 1.4 10*3/uL (ref 0.7–3.1)
Lymphs: 36 %
MCH: 28.1 pg (ref 26.6–33.0)
MCHC: 33.1 g/dL (ref 31.5–35.7)
MCV: 85 fL (ref 79–97)
Monocytes Absolute: 0.3 10*3/uL (ref 0.1–0.9)
Monocytes: 8 %
Neutrophils Absolute: 2 10*3/uL (ref 1.4–7.0)
Neutrophils: 52 %
Platelets: 187 10*3/uL (ref 150–450)
RBC: 4.02 x10E6/uL (ref 3.77–5.28)
RDW: 12.4 % (ref 11.7–15.4)
WBC: 3.9 10*3/uL (ref 3.4–10.8)

## 2022-08-21 LAB — LIPID PANEL
Chol/HDL Ratio: 3.3 ratio (ref 0.0–4.4)
Cholesterol, Total: 180 mg/dL (ref 100–199)
HDL: 55 mg/dL (ref >39)
LDL Chol Calc (NIH): 93 mg/dL (ref 0–99)
Triglycerides: 186 mg/dL — ABNORMAL HIGH (ref 0–149)
VLDL Cholesterol Cal: 32 mg/dL (ref 5–40)

## 2022-08-21 LAB — COMPREHENSIVE METABOLIC PANEL
ALT: 19 IU/L (ref 0–32)
AST: 22 IU/L (ref 0–40)
Albumin/Globulin Ratio: 2 (ref 1.2–2.2)
Albumin: 4.5 g/dL (ref 3.8–4.8)
Alkaline Phosphatase: 105 IU/L (ref 44–121)
BUN/Creatinine Ratio: 9 — ABNORMAL LOW (ref 12–28)
BUN: 13 mg/dL (ref 8–27)
Bilirubin Total: 0.3 mg/dL (ref 0.0–1.2)
CO2: 24 mmol/L (ref 20–29)
Calcium: 9.7 mg/dL (ref 8.7–10.3)
Chloride: 102 mmol/L (ref 96–106)
Creatinine, Ser: 1.49 mg/dL — ABNORMAL HIGH (ref 0.57–1.00)
Globulin, Total: 2.3 g/dL (ref 1.5–4.5)
Glucose: 107 mg/dL — ABNORMAL HIGH (ref 70–99)
Potassium: 4.5 mmol/L (ref 3.5–5.2)
Sodium: 139 mmol/L (ref 134–144)
Total Protein: 6.8 g/dL (ref 6.0–8.5)
eGFR: 36 mL/min/{1.73_m2} — ABNORMAL LOW (ref >59)

## 2022-08-21 LAB — HEMOGLOBIN A1C
Est. average glucose Bld gHb Est-mCnc: 134 mg/dL
Hgb A1c MFr Bld: 6.3 % — ABNORMAL HIGH (ref 4.8–5.6)

## 2022-08-21 LAB — CARDIOVASCULAR RISK ASSESSMENT

## 2022-08-24 ENCOUNTER — Other Ambulatory Visit: Payer: Self-pay | Admitting: Legal Medicine

## 2022-08-26 ENCOUNTER — Encounter: Payer: Self-pay | Admitting: Family Medicine

## 2022-08-26 NOTE — Assessment & Plan Note (Signed)
Well controlled.  No changes to medicines. Continue Irbesartan-Hctz 300-12.5 mg daily, Metoprolol 100 mg daily, Aspirin 81 mg daily. Continue to work on eating a healthy diet and exercise.  Labs drawn today.

## 2022-08-26 NOTE — Assessment & Plan Note (Signed)
Continue management with Dr. Bobby Rumpf and Dr. Crisoforo Oxford.

## 2022-08-26 NOTE — Assessment & Plan Note (Signed)
The current medical regimen is effective;  continue present plan and medications. Recommend continue to work on eating healthy diet and exercise. Check labs.

## 2022-08-26 NOTE — Assessment & Plan Note (Signed)
Check cbc 

## 2022-08-26 NOTE — Assessment & Plan Note (Signed)
Previously well controlled Continue Synthroid at current dose  Recheck TSH and adjust Synthroid as indicated   

## 2022-08-26 NOTE — Assessment & Plan Note (Signed)
Stable. Check cmp.  ?

## 2022-08-26 NOTE — Assessment & Plan Note (Signed)
Control: good Recommend check feet daily. Recommend annual eye exams. Medicines: Continue Metformin 1000 mg daily. Continue to work on eating a healthy diet and exercise.  Labs drawn today.

## 2022-09-03 DIAGNOSIS — R918 Other nonspecific abnormal finding of lung field: Secondary | ICD-10-CM | POA: Diagnosis not present

## 2022-09-03 DIAGNOSIS — C787 Secondary malignant neoplasm of liver and intrahepatic bile duct: Secondary | ICD-10-CM | POA: Diagnosis not present

## 2022-09-03 DIAGNOSIS — C480 Malignant neoplasm of retroperitoneum: Secondary | ICD-10-CM | POA: Diagnosis not present

## 2022-09-10 DIAGNOSIS — Z803 Family history of malignant neoplasm of breast: Secondary | ICD-10-CM | POA: Diagnosis not present

## 2022-09-10 DIAGNOSIS — C480 Malignant neoplasm of retroperitoneum: Secondary | ICD-10-CM | POA: Diagnosis not present

## 2022-09-10 DIAGNOSIS — Z905 Acquired absence of kidney: Secondary | ICD-10-CM | POA: Diagnosis not present

## 2022-09-10 DIAGNOSIS — Z79633 Long term (current) use of mitotic inhibitor: Secondary | ICD-10-CM | POA: Diagnosis not present

## 2022-09-10 DIAGNOSIS — Z800 Family history of malignant neoplasm of digestive organs: Secondary | ICD-10-CM | POA: Diagnosis not present

## 2022-09-10 DIAGNOSIS — Z79631 Long term (current) use of antimetabolite agent: Secondary | ICD-10-CM | POA: Diagnosis not present

## 2022-09-10 DIAGNOSIS — R918 Other nonspecific abnormal finding of lung field: Secondary | ICD-10-CM | POA: Diagnosis not present

## 2022-09-10 DIAGNOSIS — C787 Secondary malignant neoplasm of liver and intrahepatic bile duct: Secondary | ICD-10-CM | POA: Diagnosis not present

## 2022-09-17 DIAGNOSIS — Z7952 Long term (current) use of systemic steroids: Secondary | ICD-10-CM | POA: Diagnosis not present

## 2022-09-17 DIAGNOSIS — C7800 Secondary malignant neoplasm of unspecified lung: Secondary | ICD-10-CM | POA: Diagnosis not present

## 2022-09-17 DIAGNOSIS — R918 Other nonspecific abnormal finding of lung field: Secondary | ICD-10-CM | POA: Diagnosis not present

## 2022-09-17 DIAGNOSIS — C480 Malignant neoplasm of retroperitoneum: Secondary | ICD-10-CM | POA: Diagnosis not present

## 2022-09-17 DIAGNOSIS — Z79899 Other long term (current) drug therapy: Secondary | ICD-10-CM | POA: Diagnosis not present

## 2022-09-17 DIAGNOSIS — K5900 Constipation, unspecified: Secondary | ICD-10-CM | POA: Diagnosis not present

## 2022-09-17 DIAGNOSIS — C787 Secondary malignant neoplasm of liver and intrahepatic bile duct: Secondary | ICD-10-CM | POA: Diagnosis not present

## 2022-09-18 DIAGNOSIS — R918 Other nonspecific abnormal finding of lung field: Secondary | ICD-10-CM | POA: Diagnosis not present

## 2022-09-18 DIAGNOSIS — Z7689 Persons encountering health services in other specified circumstances: Secondary | ICD-10-CM | POA: Diagnosis not present

## 2022-09-18 DIAGNOSIS — Z7969 Long term (current) use of other immunomodulators and immunosuppressants: Secondary | ICD-10-CM | POA: Diagnosis not present

## 2022-09-18 DIAGNOSIS — C787 Secondary malignant neoplasm of liver and intrahepatic bile duct: Secondary | ICD-10-CM | POA: Diagnosis not present

## 2022-09-18 DIAGNOSIS — C480 Malignant neoplasm of retroperitoneum: Secondary | ICD-10-CM | POA: Diagnosis not present

## 2022-09-29 NOTE — Progress Notes (Signed)
[place]  [address] [place],  [place]  [address] [phone]  Clinic Day:  09/30/2022  Referring physician: Rochel Brome, MD  HISTORY OF PRESENT ILLNESS:  The patient is a 81 y.o. 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  As her hemoglobin has consistently held above 10 before last few visits, her anemia has been followed conservatively.  She comes in today for routine follow-up.  Unfortunately, since her last visit, scans have not shown metastatic leiomyosarcoma, including spread of disease to her liver and lungs.  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  However, as she now has metastatic disease, she is now on palliative chemotherapy consisting of docetaxel/gemcitabine.  The patient claims to be tolerating this regimen fairly well.  However, she has had pruritus and skin rash.  She understands she is ultimately dealing with incurable disease, with the goal of palliative chemotherapy being to keep her disease under control for as long as possible.  PHYSICAL EXAM:  Blood pressure (!) 146/74, pulse 62, temperature 99.3 F (37.4 C), resp. rate 14, height '5\' 6"'$  (1.676 m), weight 179 lb 4.8 oz (81.3 kg), SpO2 97 %. Wt Readings from Last 3 Encounters:  09/30/22 179 lb 4.8 oz (81.3 kg)  08/20/22 179 lb 9.6 oz (81.5 kg)  04/15/22 175 lb 3.2 oz (79.5 kg)   Body mass index is 28.94 kg/m. Performance status (ECOG): ]1 Physical Exam Constitutional:      Appearance: Normal appearance. She is not ill-appearing.  HENT:     Mouth/Throat:     Mouth: Mucous membranes are moist.     Pharynx: Oropharynx is clear. No oropharyngeal exudate or posterior oropharyngeal erythema.  Cardiovascular:     Rate and Rhythm: Normal rate  and regular rhythm.     Heart sounds: No murmur heard.    No friction rub. No gallop.  Pulmonary:     Effort: Pulmonary effort is normal. No respiratory distress.     Breath sounds: Normal breath sounds. No wheezing, rhonchi or rales.  Abdominal:     General: Bowel sounds are normal. There is no distension.     Palpations: Abdomen is soft. There is no mass.     Tenderness: There is no abdominal tenderness.  Musculoskeletal:        General: No swelling.     Right lower leg: No edema.     Left lower leg: No edema.  Lymphadenopathy:     Cervical: No cervical adenopathy.     Upper Body:     Right upper body: No supraclavicular or axillary adenopathy.     Left upper body: No supraclavicular or axillary adenopathy.     Lower Body: No right inguinal adenopathy. No left inguinal adenopathy.  Skin:    General: Skin is warm.     Coloration: Skin is not jaundiced.     Findings: No lesion or rash.  Neurological:     General: No focal deficit present.     Mental Status: She is alert and oriented to person, place, and time. Mental status is at baseline.  Psychiatric:        Mood and Affect: Mood normal.        Behavior: Behavior normal.        Thought Content: Thought  content normal.    LABS:    ASSESSMENT & PLAN:  Assessment/Plan:  A 81 y.o. female with a history of mild anemia secondary to iron deficiency and mild renal insufficiency.  When evaluating her labs today, her hemoglobin 10.4 is lower than what it has been previously.  However, as mentioned, she is now taking docetaxel/gemcitabine for metastatic leiomyosarcoma.  This is likely the culprit behind her progressive anemia.  She is also likely receiving white cell shot therapy with her chemotherapy;  this would likely explain her mildly elevated white count.  From a hematologic standpoint, the patient is doing fine.  She knows to contact our office over these next few months patient if she runs into any problems with her palliative  chemotherapy to where some form of supportive could be given more locally.  Otherwise, I will see her back in 6 months for repeat clinical assessment.  The patient understands all the plans discussed today and is in agreement with them.      Katherine Nickerson Macarthur Critchley, MD

## 2022-09-30 ENCOUNTER — Inpatient Hospital Stay: Payer: Medicare PPO | Attending: Oncology | Admitting: Oncology

## 2022-09-30 ENCOUNTER — Telehealth: Payer: Self-pay | Admitting: Oncology

## 2022-09-30 ENCOUNTER — Inpatient Hospital Stay: Payer: Medicare PPO

## 2022-09-30 ENCOUNTER — Other Ambulatory Visit (INDEPENDENT_AMBULATORY_CARE_PROVIDER_SITE_OTHER): Payer: Medicare PPO | Admitting: Oncology

## 2022-09-30 VITALS — BP 146/74 | HR 62 | Temp 99.3°F | Resp 14 | Ht 66.0 in | Wt 179.3 lb

## 2022-09-30 DIAGNOSIS — N189 Chronic kidney disease, unspecified: Secondary | ICD-10-CM

## 2022-09-30 DIAGNOSIS — N1831 Chronic kidney disease, stage 3a: Secondary | ICD-10-CM

## 2022-09-30 DIAGNOSIS — D649 Anemia, unspecified: Secondary | ICD-10-CM | POA: Diagnosis not present

## 2022-09-30 DIAGNOSIS — D631 Anemia in chronic kidney disease: Secondary | ICD-10-CM

## 2022-09-30 DIAGNOSIS — D509 Iron deficiency anemia, unspecified: Secondary | ICD-10-CM | POA: Diagnosis not present

## 2022-09-30 DIAGNOSIS — I129 Hypertensive chronic kidney disease with stage 1 through stage 4 chronic kidney disease, or unspecified chronic kidney disease: Secondary | ICD-10-CM

## 2022-09-30 LAB — IRON AND TIBC
Iron: 72 ug/dL (ref 28–170)
Saturation Ratios: 24 % (ref 10.4–31.8)
TIBC: 301 ug/dL (ref 250–450)
UIBC: 229 ug/dL

## 2022-09-30 LAB — CMP (CANCER CENTER ONLY)
ALT: 21 U/L (ref 0–44)
AST: 20 U/L (ref 15–41)
Albumin: 3.6 g/dL (ref 3.5–5.0)
Alkaline Phosphatase: 123 U/L (ref 38–126)
Anion gap: 9 (ref 5–15)
BUN: 9 mg/dL (ref 8–23)
CO2: 23 mmol/L (ref 22–32)
Calcium: 8.9 mg/dL (ref 8.9–10.3)
Chloride: 105 mmol/L (ref 98–111)
Creatinine: 1.37 mg/dL — ABNORMAL HIGH (ref 0.44–1.00)
GFR, Estimated: 39 mL/min — ABNORMAL LOW (ref >60)
Glucose, Bld: 111 mg/dL — ABNORMAL HIGH (ref 70–99)
Potassium: 3.9 mmol/L (ref 3.5–5.1)
Sodium: 137 mmol/L (ref 135–145)
Total Bilirubin: 0.4 mg/dL (ref 0.3–1.2)
Total Protein: 6.7 g/dL (ref 6.5–8.1)

## 2022-09-30 LAB — CBC AND DIFFERENTIAL
HCT: 31 — AB (ref 36–46)
Hemoglobin: 10.2 — AB (ref 12.0–16.0)
Neutrophils Absolute: 10.08
Platelets: 346 10*3/uL (ref 150–400)
WBC: 12.6

## 2022-09-30 LAB — FERRITIN: Ferritin: 176 ng/mL (ref 11–307)

## 2022-09-30 LAB — CBC: RBC: 3.54 — AB (ref 3.87–5.11)

## 2022-09-30 NOTE — Telephone Encounter (Signed)
09/30/22 Next appt scheduled and confirmed with patient

## 2022-10-01 DIAGNOSIS — R918 Other nonspecific abnormal finding of lung field: Secondary | ICD-10-CM | POA: Diagnosis not present

## 2022-10-01 DIAGNOSIS — R591 Generalized enlarged lymph nodes: Secondary | ICD-10-CM | POA: Diagnosis not present

## 2022-10-01 DIAGNOSIS — C787 Secondary malignant neoplasm of liver and intrahepatic bile duct: Secondary | ICD-10-CM | POA: Diagnosis not present

## 2022-10-01 DIAGNOSIS — C480 Malignant neoplasm of retroperitoneum: Secondary | ICD-10-CM | POA: Diagnosis not present

## 2022-10-02 DIAGNOSIS — Z452 Encounter for adjustment and management of vascular access device: Secondary | ICD-10-CM | POA: Diagnosis not present

## 2022-10-02 DIAGNOSIS — C787 Secondary malignant neoplasm of liver and intrahepatic bile duct: Secondary | ICD-10-CM | POA: Diagnosis not present

## 2022-10-02 DIAGNOSIS — C480 Malignant neoplasm of retroperitoneum: Secondary | ICD-10-CM | POA: Diagnosis not present

## 2022-10-02 DIAGNOSIS — R918 Other nonspecific abnormal finding of lung field: Secondary | ICD-10-CM | POA: Diagnosis not present

## 2022-10-07 ENCOUNTER — Other Ambulatory Visit: Payer: Self-pay | Admitting: Family Medicine

## 2022-10-08 DIAGNOSIS — Z803 Family history of malignant neoplasm of breast: Secondary | ICD-10-CM | POA: Diagnosis not present

## 2022-10-08 DIAGNOSIS — Z79899 Other long term (current) drug therapy: Secondary | ICD-10-CM | POA: Diagnosis not present

## 2022-10-08 DIAGNOSIS — C480 Malignant neoplasm of retroperitoneum: Secondary | ICD-10-CM | POA: Diagnosis not present

## 2022-10-08 DIAGNOSIS — R591 Generalized enlarged lymph nodes: Secondary | ICD-10-CM | POA: Diagnosis not present

## 2022-10-08 DIAGNOSIS — Z800 Family history of malignant neoplasm of digestive organs: Secondary | ICD-10-CM | POA: Diagnosis not present

## 2022-10-08 DIAGNOSIS — F1090 Alcohol use, unspecified, uncomplicated: Secondary | ICD-10-CM | POA: Diagnosis not present

## 2022-10-08 DIAGNOSIS — C7800 Secondary malignant neoplasm of unspecified lung: Secondary | ICD-10-CM | POA: Diagnosis not present

## 2022-10-08 DIAGNOSIS — C787 Secondary malignant neoplasm of liver and intrahepatic bile duct: Secondary | ICD-10-CM | POA: Diagnosis not present

## 2022-10-08 DIAGNOSIS — Z87891 Personal history of nicotine dependence: Secondary | ICD-10-CM | POA: Diagnosis not present

## 2022-10-22 DIAGNOSIS — C480 Malignant neoplasm of retroperitoneum: Secondary | ICD-10-CM | POA: Diagnosis not present

## 2022-10-22 DIAGNOSIS — Z79633 Long term (current) use of mitotic inhibitor: Secondary | ICD-10-CM | POA: Diagnosis not present

## 2022-10-22 DIAGNOSIS — Z803 Family history of malignant neoplasm of breast: Secondary | ICD-10-CM | POA: Diagnosis not present

## 2022-10-22 DIAGNOSIS — Z79899 Other long term (current) drug therapy: Secondary | ICD-10-CM | POA: Diagnosis not present

## 2022-10-22 DIAGNOSIS — C7800 Secondary malignant neoplasm of unspecified lung: Secondary | ICD-10-CM | POA: Diagnosis not present

## 2022-10-22 DIAGNOSIS — Z800 Family history of malignant neoplasm of digestive organs: Secondary | ICD-10-CM | POA: Diagnosis not present

## 2022-10-22 DIAGNOSIS — Z79631 Long term (current) use of antimetabolite agent: Secondary | ICD-10-CM | POA: Diagnosis not present

## 2022-10-22 DIAGNOSIS — C787 Secondary malignant neoplasm of liver and intrahepatic bile duct: Secondary | ICD-10-CM | POA: Diagnosis not present

## 2022-10-22 DIAGNOSIS — Z87891 Personal history of nicotine dependence: Secondary | ICD-10-CM | POA: Diagnosis not present

## 2022-10-23 DIAGNOSIS — C480 Malignant neoplasm of retroperitoneum: Secondary | ICD-10-CM | POA: Diagnosis not present

## 2022-10-23 DIAGNOSIS — C787 Secondary malignant neoplasm of liver and intrahepatic bile duct: Secondary | ICD-10-CM | POA: Diagnosis not present

## 2022-10-23 DIAGNOSIS — C7800 Secondary malignant neoplasm of unspecified lung: Secondary | ICD-10-CM | POA: Diagnosis not present

## 2022-10-23 DIAGNOSIS — Z7689 Persons encountering health services in other specified circumstances: Secondary | ICD-10-CM | POA: Diagnosis not present

## 2022-10-29 DIAGNOSIS — C480 Malignant neoplasm of retroperitoneum: Secondary | ICD-10-CM | POA: Diagnosis not present

## 2022-10-29 DIAGNOSIS — C7800 Secondary malignant neoplasm of unspecified lung: Secondary | ICD-10-CM | POA: Diagnosis not present

## 2022-10-29 DIAGNOSIS — C787 Secondary malignant neoplasm of liver and intrahepatic bile duct: Secondary | ICD-10-CM | POA: Diagnosis not present

## 2022-10-29 DIAGNOSIS — Z5111 Encounter for antineoplastic chemotherapy: Secondary | ICD-10-CM | POA: Diagnosis not present

## 2022-11-11 ENCOUNTER — Other Ambulatory Visit: Payer: Self-pay | Admitting: Family Medicine

## 2022-11-12 ENCOUNTER — Other Ambulatory Visit: Payer: Self-pay | Admitting: Family Medicine

## 2022-11-12 DIAGNOSIS — D508 Other iron deficiency anemias: Secondary | ICD-10-CM | POA: Diagnosis not present

## 2022-11-12 DIAGNOSIS — Z9049 Acquired absence of other specified parts of digestive tract: Secondary | ICD-10-CM | POA: Diagnosis not present

## 2022-11-12 DIAGNOSIS — C480 Malignant neoplasm of retroperitoneum: Secondary | ICD-10-CM | POA: Diagnosis not present

## 2022-11-12 DIAGNOSIS — Z79633 Long term (current) use of mitotic inhibitor: Secondary | ICD-10-CM | POA: Diagnosis not present

## 2022-11-12 DIAGNOSIS — D630 Anemia in neoplastic disease: Secondary | ICD-10-CM | POA: Diagnosis not present

## 2022-11-12 DIAGNOSIS — K8689 Other specified diseases of pancreas: Secondary | ICD-10-CM | POA: Diagnosis not present

## 2022-11-12 DIAGNOSIS — C787 Secondary malignant neoplasm of liver and intrahepatic bile duct: Secondary | ICD-10-CM | POA: Diagnosis not present

## 2022-11-12 DIAGNOSIS — Z79631 Long term (current) use of antimetabolite agent: Secondary | ICD-10-CM | POA: Diagnosis not present

## 2022-11-12 DIAGNOSIS — R918 Other nonspecific abnormal finding of lung field: Secondary | ICD-10-CM | POA: Diagnosis not present

## 2022-11-12 DIAGNOSIS — C7800 Secondary malignant neoplasm of unspecified lung: Secondary | ICD-10-CM | POA: Diagnosis not present

## 2022-11-12 DIAGNOSIS — Z905 Acquired absence of kidney: Secondary | ICD-10-CM | POA: Diagnosis not present

## 2022-11-12 DIAGNOSIS — Z95828 Presence of other vascular implants and grafts: Secondary | ICD-10-CM | POA: Diagnosis not present

## 2022-11-13 DIAGNOSIS — C480 Malignant neoplasm of retroperitoneum: Secondary | ICD-10-CM | POA: Diagnosis not present

## 2022-11-13 DIAGNOSIS — C7800 Secondary malignant neoplasm of unspecified lung: Secondary | ICD-10-CM | POA: Diagnosis not present

## 2022-11-13 DIAGNOSIS — Z7689 Persons encountering health services in other specified circumstances: Secondary | ICD-10-CM | POA: Diagnosis not present

## 2022-11-13 DIAGNOSIS — C772 Secondary and unspecified malignant neoplasm of intra-abdominal lymph nodes: Secondary | ICD-10-CM | POA: Diagnosis not present

## 2022-11-13 DIAGNOSIS — Z87891 Personal history of nicotine dependence: Secondary | ICD-10-CM | POA: Diagnosis not present

## 2022-11-19 DIAGNOSIS — C7800 Secondary malignant neoplasm of unspecified lung: Secondary | ICD-10-CM | POA: Diagnosis not present

## 2022-11-19 DIAGNOSIS — R918 Other nonspecific abnormal finding of lung field: Secondary | ICD-10-CM | POA: Diagnosis not present

## 2022-11-19 DIAGNOSIS — C787 Secondary malignant neoplasm of liver and intrahepatic bile duct: Secondary | ICD-10-CM | POA: Diagnosis not present

## 2022-11-19 DIAGNOSIS — Z87891 Personal history of nicotine dependence: Secondary | ICD-10-CM | POA: Diagnosis not present

## 2022-11-19 DIAGNOSIS — C480 Malignant neoplasm of retroperitoneum: Secondary | ICD-10-CM | POA: Diagnosis not present

## 2022-11-26 DIAGNOSIS — C787 Secondary malignant neoplasm of liver and intrahepatic bile duct: Secondary | ICD-10-CM | POA: Diagnosis not present

## 2022-11-26 DIAGNOSIS — C772 Secondary and unspecified malignant neoplasm of intra-abdominal lymph nodes: Secondary | ICD-10-CM | POA: Diagnosis not present

## 2022-11-26 DIAGNOSIS — Z87891 Personal history of nicotine dependence: Secondary | ICD-10-CM | POA: Diagnosis not present

## 2022-11-26 DIAGNOSIS — C7800 Secondary malignant neoplasm of unspecified lung: Secondary | ICD-10-CM | POA: Diagnosis not present

## 2022-11-26 DIAGNOSIS — R918 Other nonspecific abnormal finding of lung field: Secondary | ICD-10-CM | POA: Diagnosis not present

## 2022-11-26 DIAGNOSIS — C480 Malignant neoplasm of retroperitoneum: Secondary | ICD-10-CM | POA: Diagnosis not present

## 2022-12-03 DIAGNOSIS — R918 Other nonspecific abnormal finding of lung field: Secondary | ICD-10-CM | POA: Diagnosis not present

## 2022-12-03 DIAGNOSIS — C787 Secondary malignant neoplasm of liver and intrahepatic bile duct: Secondary | ICD-10-CM | POA: Diagnosis not present

## 2022-12-03 DIAGNOSIS — C480 Malignant neoplasm of retroperitoneum: Secondary | ICD-10-CM | POA: Diagnosis not present

## 2022-12-10 DIAGNOSIS — Z800 Family history of malignant neoplasm of digestive organs: Secondary | ICD-10-CM | POA: Diagnosis not present

## 2022-12-10 DIAGNOSIS — C787 Secondary malignant neoplasm of liver and intrahepatic bile duct: Secondary | ICD-10-CM | POA: Diagnosis not present

## 2022-12-10 DIAGNOSIS — R5383 Other fatigue: Secondary | ICD-10-CM | POA: Diagnosis not present

## 2022-12-10 DIAGNOSIS — Z803 Family history of malignant neoplasm of breast: Secondary | ICD-10-CM | POA: Diagnosis not present

## 2022-12-10 DIAGNOSIS — C480 Malignant neoplasm of retroperitoneum: Secondary | ICD-10-CM | POA: Diagnosis not present

## 2022-12-10 DIAGNOSIS — R197 Diarrhea, unspecified: Secondary | ICD-10-CM | POA: Diagnosis not present

## 2022-12-10 DIAGNOSIS — C7800 Secondary malignant neoplasm of unspecified lung: Secondary | ICD-10-CM | POA: Diagnosis not present

## 2022-12-10 DIAGNOSIS — Z79899 Other long term (current) drug therapy: Secondary | ICD-10-CM | POA: Diagnosis not present

## 2022-12-10 DIAGNOSIS — Z87891 Personal history of nicotine dependence: Secondary | ICD-10-CM | POA: Diagnosis not present

## 2022-12-15 ENCOUNTER — Other Ambulatory Visit: Payer: Self-pay | Admitting: Family Medicine

## 2022-12-16 ENCOUNTER — Encounter: Payer: Self-pay | Admitting: Pharmacist

## 2022-12-17 DIAGNOSIS — C787 Secondary malignant neoplasm of liver and intrahepatic bile duct: Secondary | ICD-10-CM | POA: Diagnosis not present

## 2022-12-17 DIAGNOSIS — C480 Malignant neoplasm of retroperitoneum: Secondary | ICD-10-CM | POA: Diagnosis not present

## 2022-12-17 DIAGNOSIS — R918 Other nonspecific abnormal finding of lung field: Secondary | ICD-10-CM | POA: Diagnosis not present

## 2022-12-21 ENCOUNTER — Other Ambulatory Visit: Payer: Self-pay | Admitting: Family Medicine

## 2022-12-24 ENCOUNTER — Ambulatory Visit: Payer: Medicare PPO | Admitting: Family Medicine

## 2022-12-24 DIAGNOSIS — Z79890 Hormone replacement therapy (postmenopausal): Secondary | ICD-10-CM | POA: Diagnosis not present

## 2022-12-24 DIAGNOSIS — C480 Malignant neoplasm of retroperitoneum: Secondary | ICD-10-CM | POA: Diagnosis not present

## 2022-12-24 DIAGNOSIS — Z7982 Long term (current) use of aspirin: Secondary | ICD-10-CM | POA: Diagnosis not present

## 2022-12-24 DIAGNOSIS — C7800 Secondary malignant neoplasm of unspecified lung: Secondary | ICD-10-CM | POA: Diagnosis not present

## 2022-12-24 DIAGNOSIS — C787 Secondary malignant neoplasm of liver and intrahepatic bile duct: Secondary | ICD-10-CM | POA: Diagnosis not present

## 2022-12-24 DIAGNOSIS — Z905 Acquired absence of kidney: Secondary | ICD-10-CM | POA: Diagnosis not present

## 2022-12-24 DIAGNOSIS — K521 Toxic gastroenteritis and colitis: Secondary | ICD-10-CM | POA: Diagnosis not present

## 2022-12-24 DIAGNOSIS — Z7952 Long term (current) use of systemic steroids: Secondary | ICD-10-CM | POA: Diagnosis not present

## 2022-12-24 DIAGNOSIS — T451X5A Adverse effect of antineoplastic and immunosuppressive drugs, initial encounter: Secondary | ICD-10-CM | POA: Diagnosis not present

## 2022-12-26 DIAGNOSIS — Z7689 Persons encountering health services in other specified circumstances: Secondary | ICD-10-CM | POA: Diagnosis not present

## 2022-12-26 DIAGNOSIS — C480 Malignant neoplasm of retroperitoneum: Secondary | ICD-10-CM | POA: Diagnosis not present

## 2022-12-26 DIAGNOSIS — C787 Secondary malignant neoplasm of liver and intrahepatic bile duct: Secondary | ICD-10-CM | POA: Diagnosis not present

## 2022-12-26 DIAGNOSIS — R918 Other nonspecific abnormal finding of lung field: Secondary | ICD-10-CM | POA: Diagnosis not present

## 2022-12-30 DIAGNOSIS — C787 Secondary malignant neoplasm of liver and intrahepatic bile duct: Secondary | ICD-10-CM | POA: Diagnosis not present

## 2022-12-30 DIAGNOSIS — K7689 Other specified diseases of liver: Secondary | ICD-10-CM | POA: Diagnosis not present

## 2022-12-30 DIAGNOSIS — Z905 Acquired absence of kidney: Secondary | ICD-10-CM | POA: Diagnosis not present

## 2022-12-30 DIAGNOSIS — R918 Other nonspecific abnormal finding of lung field: Secondary | ICD-10-CM | POA: Diagnosis not present

## 2022-12-30 DIAGNOSIS — K8689 Other specified diseases of pancreas: Secondary | ICD-10-CM | POA: Diagnosis not present

## 2022-12-30 DIAGNOSIS — J432 Centrilobular emphysema: Secondary | ICD-10-CM | POA: Diagnosis not present

## 2022-12-30 DIAGNOSIS — C480 Malignant neoplasm of retroperitoneum: Secondary | ICD-10-CM | POA: Diagnosis not present

## 2022-12-30 DIAGNOSIS — K5730 Diverticulosis of large intestine without perforation or abscess without bleeding: Secondary | ICD-10-CM | POA: Diagnosis not present

## 2022-12-30 DIAGNOSIS — C7800 Secondary malignant neoplasm of unspecified lung: Secondary | ICD-10-CM | POA: Diagnosis not present

## 2022-12-30 DIAGNOSIS — N3289 Other specified disorders of bladder: Secondary | ICD-10-CM | POA: Diagnosis not present

## 2022-12-30 DIAGNOSIS — N2889 Other specified disorders of kidney and ureter: Secondary | ICD-10-CM | POA: Diagnosis not present

## 2023-01-07 DIAGNOSIS — C787 Secondary malignant neoplasm of liver and intrahepatic bile duct: Secondary | ICD-10-CM | POA: Diagnosis not present

## 2023-01-07 DIAGNOSIS — R918 Other nonspecific abnormal finding of lung field: Secondary | ICD-10-CM | POA: Diagnosis not present

## 2023-01-07 DIAGNOSIS — C480 Malignant neoplasm of retroperitoneum: Secondary | ICD-10-CM | POA: Diagnosis not present

## 2023-01-07 DIAGNOSIS — Z5111 Encounter for antineoplastic chemotherapy: Secondary | ICD-10-CM | POA: Diagnosis not present

## 2023-01-09 DIAGNOSIS — R918 Other nonspecific abnormal finding of lung field: Secondary | ICD-10-CM | POA: Diagnosis not present

## 2023-01-14 DIAGNOSIS — R918 Other nonspecific abnormal finding of lung field: Secondary | ICD-10-CM | POA: Diagnosis not present

## 2023-01-14 DIAGNOSIS — C787 Secondary malignant neoplasm of liver and intrahepatic bile duct: Secondary | ICD-10-CM | POA: Diagnosis not present

## 2023-01-14 DIAGNOSIS — C480 Malignant neoplasm of retroperitoneum: Secondary | ICD-10-CM | POA: Diagnosis not present

## 2023-01-14 DIAGNOSIS — Z5111 Encounter for antineoplastic chemotherapy: Secondary | ICD-10-CM | POA: Diagnosis not present

## 2023-01-21 DIAGNOSIS — Z5111 Encounter for antineoplastic chemotherapy: Secondary | ICD-10-CM | POA: Diagnosis not present

## 2023-01-21 DIAGNOSIS — C480 Malignant neoplasm of retroperitoneum: Secondary | ICD-10-CM | POA: Diagnosis not present

## 2023-01-21 DIAGNOSIS — C787 Secondary malignant neoplasm of liver and intrahepatic bile duct: Secondary | ICD-10-CM | POA: Diagnosis not present

## 2023-01-21 DIAGNOSIS — R918 Other nonspecific abnormal finding of lung field: Secondary | ICD-10-CM | POA: Diagnosis not present

## 2023-01-23 ENCOUNTER — Encounter: Payer: Self-pay | Admitting: Pharmacist

## 2023-01-27 ENCOUNTER — Ambulatory Visit: Payer: Medicare PPO | Admitting: Family Medicine

## 2023-01-27 ENCOUNTER — Encounter: Payer: Self-pay | Admitting: Family Medicine

## 2023-01-27 VITALS — BP 110/80 | HR 70 | Temp 96.9°F | Ht 68.0 in | Wt 164.0 lb

## 2023-01-27 DIAGNOSIS — C480 Malignant neoplasm of retroperitoneum: Secondary | ICD-10-CM | POA: Diagnosis not present

## 2023-01-27 DIAGNOSIS — E1159 Type 2 diabetes mellitus with other circulatory complications: Secondary | ICD-10-CM | POA: Diagnosis not present

## 2023-01-27 DIAGNOSIS — Z1231 Encounter for screening mammogram for malignant neoplasm of breast: Secondary | ICD-10-CM

## 2023-01-27 DIAGNOSIS — E038 Other specified hypothyroidism: Secondary | ICD-10-CM

## 2023-01-27 DIAGNOSIS — E1121 Type 2 diabetes mellitus with diabetic nephropathy: Secondary | ICD-10-CM | POA: Diagnosis not present

## 2023-01-27 DIAGNOSIS — I129 Hypertensive chronic kidney disease with stage 1 through stage 4 chronic kidney disease, or unspecified chronic kidney disease: Secondary | ICD-10-CM | POA: Diagnosis not present

## 2023-01-27 DIAGNOSIS — N1831 Chronic kidney disease, stage 3a: Secondary | ICD-10-CM | POA: Diagnosis not present

## 2023-01-27 DIAGNOSIS — D696 Thrombocytopenia, unspecified: Secondary | ICD-10-CM | POA: Diagnosis not present

## 2023-01-27 DIAGNOSIS — E782 Mixed hyperlipidemia: Secondary | ICD-10-CM | POA: Diagnosis not present

## 2023-01-27 MED ORDER — CETIRIZINE HCL 10 MG PO TABS: 10.0000 mg | ORAL_TABLET | Freq: Every day | ORAL | 3 refills | Status: DC

## 2023-01-27 NOTE — Patient Instructions (Signed)
Recommend start zyrtec 10 mg before bed.   Request Lipid panel, HbA1C, and TSH to be done with lab draw at oncology.

## 2023-01-27 NOTE — Progress Notes (Unsigned)
Subjective:  Patient ID: Katherine Richmond, female    DOB: 1941-03-16  Age: 82 y.o. MRN: [id number]  Chief Complaint  Patient presents with   Diabetes   Hyperlipidemia   Hypertension   Hypothyroidism   HPI:  Diabetes:  Complications: Hyperlipidemia, Hypertension. Glucose checking: not checking. Most recent A1C: 6.3 % Current medications: Metformin 1000 mg daily. Last Eye Exam: [place] in March 2024.  Foot checks: daily   Hyperlipidemia: Current medications: Atorvastatin 40 mg daily, Omega-3 1000 mg twice a day. Eating healthy. exercising   Hypertension: Complications: Diabetes, Hypertension. Current medications: Irbesartan-Hctz 300-12.5 mg daily, Metoprolol 100 mg daily, Aspirin 81 mg daily.   Hypothyroidism: She is taking Levothyroxine 50 mcg daily.   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 Has been on chemo x 5 months. Wbc drops.     01/27/2023   10:19 AM 06/19/2021    4:02 PM 02/18/2021    4:29 PM 12/21/2019    4:12 PM  Depression screen PHQ 2/9  Decreased Interest 0 0 0 0  Down, Depressed, Hopeless 0 0 0 0  PHQ - 2 Score 0 0 0 0         06/19/2021    4:02 PM 10/01/2021   10:24 AM 04/02/2022    9:36 AM 09/30/2022   10:56 AM 01/27/2023   10:19 AM  Fall Risk  Falls in the past year? 0    0  Was there an injury with Fall? 0    0  Fall Risk Category Calculator 0    0  Fall Risk Category (Retired) Low      (RETIRED) Patient Fall Risk Level Low fall risk Low fall risk Low fall risk Low fall risk   Patient at Risk for Falls Due to No Fall Risks    No Fall Risks  Fall risk Follow up Falls evaluation completed    Falls evaluation completed      Review of Systems  Constitutional:  Negative for chills, fatigue and fever.  HENT:  Negative for congestion, ear pain, rhinorrhea and sore throat.   Respiratory:  Negative for cough and shortness of breath.   Cardiovascular:  Negative for chest pain.  Gastrointestinal:  Positive for  constipation (alternates due to chemo.) and diarrhea. Negative for abdominal pain, nausea and vomiting.  Genitourinary:  Negative for dysuria and urgency.  Musculoskeletal:  Negative for back pain and myalgias.  Neurological:  Negative for dizziness, weakness, light-headedness and headaches.  Psychiatric/Behavioral:  Negative for dysphoric mood. The patient is not nervous/anxious.     Current Outpatient Medications on File Prior to Visit  Medication Sig Dispense Refill   aspirin 81 MG EC tablet Take 81 mg by mouth daily.     atorvastatin (LIPITOR) 40 MG tablet Take 1 tablet (40 mg total) by mouth daily. 90 tablet 0   estradiol (ESTRACE) 1 MG tablet Take 1 tablet by mouth daily.     ferrous sulfate 325 (65 FE) MG tablet Take by mouth.     fluticasone (FLONASE) 50 MCG/ACT nasal spray Place 2 sprays into both nostrils daily. 48 g 1   gabapentin (NEURONTIN) 300 MG capsule Take 1 capsule (300 mg total) by mouth 2 (two) times daily. 180 capsule 1   irbesartan-hydrochlorothiazide (AVALIDE) 300-12.5 MG tablet Take 1 tablet by mouth daily. 90 tablet 0   levothyroxine (SYNTHROID) 50 MCG tablet TAKE ONE TABLET BY MOUTH DAILY 90 tablet 1   metFORMIN (GLUCOPHAGE) 1000 MG tablet Take 1 tablet (1,000  mg total) by mouth daily. 90 tablet 1   metoprolol succinate (TOPROL-XL) 100 MG 24 hr tablet Take 1 tablet (100 mg total) by mouth daily. 90 tablet 1   Multiple Vitamins-Minerals (MULTIPLE VITAMINS/WOMENS PO) Take by mouth.     Omega-3 Fatty Acids (OMEGA-3 FISH OIL) 1200 MG CAPS Take 1 capsule by mouth in the morning and at bedtime.     traZODone (DESYREL) 50 MG tablet Take 1 tablet (50 mg total) by mouth at bedtime. 90 tablet 3   No current facility-administered medications on file prior to visit.   Past Medical History:  Diagnosis Date   Anemia    Cancer ([place]) 07/2018   Leiomyosarcoma   Chronic kidney disease    N18.3a).   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   TONSILLECTOMY  2002    Family History  Problem Relation Age of Onset   Breast cancer Mother    Colon cancer Mother    Renal cancer Mother    Liver cancer Half-Brother    Multiple sclerosis Half-Brother    Hypertension Other    Transient ischemic attack Other    Glaucoma Other    Social History   Socioeconomic History   Marital status: Married    Spouse name: Iona Beard Kaser   Number of children: 1   Years of education: Not on file   Highest education level: Not on file  Occupational History   Occupation: RETIRED   Occupation: [employer] - W[employer], [employer], FRI  Tobacco Use   Smoking status: Former    Types: Cigarettes    Quit date: 2012    Years since quitting: 12.2   Smokeless tobacco: Never  Substance and Sexual Activity   Alcohol use: Yes    Alcohol/week: 1.0 standard drink of alcohol    Types: 1 Glasses of wine per week    Comment: SOCIAL USE ONLY   Drug use: Never   Sexual activity: Not on file  Other Topics Concern   Not on file  Social History Narrative   Not on file   Social Determinants of Health   Financial Resource Strain: Low Risk  (01/27/2023)   Overall Financial Resource Strain (CARDIA)    Difficulty of Paying Living Expenses: Not hard at all  Food Insecurity: No Food Insecurity (03/19/2021)   Hunger Vital Sign    Worried About Running Out of Food in the Last Year: Never true    [place] in the Last Year: Never true  Transportation Needs: No Transportation Needs (03/19/2021)   PRAPARE  - Hydrologist (Medical): No    Lack of Transportation (Non-Medical): No  Physical Activity: Insufficiently Active (03/19/2021)   Exercise Vital Sign    Days of Exercise per Week: 2 days    Minutes of Exercise per Session: 30 min  Stress: No Stress Concern Present (01/27/2023)   [place]    Feeling of  Stress : Not at all  Social Connections: Moderately Isolated (01/27/2023)   Social Connection and Isolation Panel [NHANES]    Frequency of Communication with Friends and Family: Three times a week    Frequency of Social Gatherings with Friends and Family: Three times a week    Attends Religious Services: Never    Active Member of Clubs or Organizations: No    Attends Archivist Meetings: Never    Marital Status: Married    Objective:  Temp (!) 96.9 F (36.1 C)   Ht 5\' 8"  (1.727 m)   Wt 164 lb (74.4 kg)   SpO2 90%   BMI 24.94 kg/m      01/27/2023   10:15 AM 09/30/2022   10:56 AM 08/20/2022    8:35 PM  BP/Weight  Systolic BP  123456 123456  Diastolic BP  74 62  Wt. (Lbs) 164 179.3 179.6  BMI 24.94 kg/m2 28.94 kg/m2 28.99 kg/m2    Physical Exam  Diabetic Foot Exam - Simple   No data filed      Lab Results  Component Value Date   WBC 12.6 09/30/2022   HGB 10.2 (A) 09/30/2022   HCT 31 (A) 09/30/2022   PLT 346 09/30/2022   GLUCOSE 111 (H) 09/30/2022   CHOL 180 08/20/2022   TRIG 186 (H) 08/20/2022   HDL 55 08/20/2022   LDLCALC 93 08/20/2022   ALT 21 09/30/2022   AST 20 09/30/2022   NA 137 09/30/2022   K 3.9 09/30/2022   CL 105 09/30/2022   CREATININE 1.37 (H) 09/30/2022   BUN 9 09/30/2022   CO2 23 09/30/2022   TSH 1.320 04/15/2022   HGBA1C 6.3 (H) 08/20/2022   MICROALBUR 10 08/06/2021      Assessment & Plan:    Diabetic glomerulopathy ([place]) Assessment & Plan: Control: good Recommend check feet daily. Recommend annual eye exams. Medicines: Continue  Metformin 1000 mg daily. Continue to work on eating a healthy diet and exercise.  Labs drawn today.     Orders: -     Hemoglobin A1c; Future  Hypertension complicating diabetes ([place]) Assessment & Plan: Well controlled.  No changes to medicines. Continue Irbesartan-Hctz 300-12.5 mg daily, Metoprolol 100 mg daily, Aspirin 81 mg daily. Continue to work on eating a healthy diet and exercise.  Ordered Labs.    Secondary hypothyroidism Assessment & Plan: Previously well controlled Continue Synthroid at current dose  Recheck TSH and adjust Synthroid as indicated    Orders: -     TSH; Future  Thrombocytopenia ([facility]) Assessment & Plan: Management per specialist.   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 Recommend continue to work on eating healthy diet and exercise. Check labs.    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 Bobby Rumpf and Dr. Crisoforo Oxford.    Mixed hyperlipidemia Assessment & Plan: Well controlled.  No changes to medicines. Atorvastatin 40 mg daily, Omega-3 1000 mg twice a day.  Continue to work on eating a healthy diet and exercise.    Orders: -     Lipid panel; Future  Other orders -     Cetirizine HCl; Take 1 tablet (10 mg total) by mouth at bedtime.  Dispense: 90 tablet; Refill: 3     Meds ordered this encounter  Medications   cetirizine (ZYRTEC ALLERGY) 10 MG tablet    Sig: Take 1 tablet (10 mg  total) by mouth at bedtime.    Dispense:  90 tablet    Refill:  3    Orders Placed This Encounter  Procedures   Lipid panel   TSH   Hemoglobin A1c     Follow-up: Return in about 6 months (around 07/30/2023) for chronic fasting.   I,Marla I Leal-Borjas,acting as a scribe for Rochel Brome, MD.,have documented all relevant documentation on the behalf of Rochel Brome, MD,as directed by   Rochel Brome, MD while in the presence of Rochel Brome, MD.    An After Visit Summary was printed and given to the patient.  Rochel Brome, MD [facility] [phone]

## 2023-01-28 DIAGNOSIS — Z5111 Encounter for antineoplastic chemotherapy: Secondary | ICD-10-CM | POA: Diagnosis not present

## 2023-01-28 DIAGNOSIS — C480 Malignant neoplasm of retroperitoneum: Secondary | ICD-10-CM | POA: Diagnosis not present

## 2023-01-28 DIAGNOSIS — R918 Other nonspecific abnormal finding of lung field: Secondary | ICD-10-CM | POA: Diagnosis not present

## 2023-01-28 DIAGNOSIS — C787 Secondary malignant neoplasm of liver and intrahepatic bile duct: Secondary | ICD-10-CM | POA: Diagnosis not present

## 2023-01-28 DIAGNOSIS — C499 Malignant neoplasm of connective and soft tissue, unspecified: Secondary | ICD-10-CM | POA: Diagnosis not present

## 2023-01-29 DIAGNOSIS — R918 Other nonspecific abnormal finding of lung field: Secondary | ICD-10-CM | POA: Diagnosis not present

## 2023-01-29 DIAGNOSIS — C787 Secondary malignant neoplasm of liver and intrahepatic bile duct: Secondary | ICD-10-CM | POA: Diagnosis not present

## 2023-01-31 NOTE — Assessment & Plan Note (Signed)
Continue management with Dr. Lewis and Dr. Shen.  

## 2023-01-31 NOTE — Assessment & Plan Note (Signed)
Control: good Recommend check feet daily. Recommend annual eye exams. Medicines: Continue Metformin 1000 mg daily. Continue to work on eating a healthy diet and exercise.  Labs drawn today.    

## 2023-01-31 NOTE — Assessment & Plan Note (Signed)
The current medical regimen is effective;  continue present plan and medications. Recommend continue to work on eating healthy diet and exercise. Check labs.  

## 2023-01-31 NOTE — Assessment & Plan Note (Signed)
Previously well controlled Continue Synthroid at current dose  Recheck TSH and adjust Synthroid as indicated   

## 2023-01-31 NOTE — Assessment & Plan Note (Signed)
Well controlled.  No changes to medicines. Continue Irbesartan-Hctz 300-12.5 mg daily, Metoprolol 100 mg daily, Aspirin 81 mg daily. Continue to work on eating a healthy diet and exercise.  Ordered Labs.

## 2023-01-31 NOTE — Assessment & Plan Note (Signed)
Well controlled.  No changes to medicines. Atorvastatin 40 mg daily, Omega-3 1000 mg twice a day.  Continue to work on eating a healthy diet and exercise.

## 2023-01-31 NOTE — Assessment & Plan Note (Signed)
Management per specialist. 

## 2023-02-03 ENCOUNTER — Ambulatory Visit: Payer: Medicare PPO

## 2023-02-03 DIAGNOSIS — E038 Other specified hypothyroidism: Secondary | ICD-10-CM

## 2023-02-03 DIAGNOSIS — E1121 Type 2 diabetes mellitus with diabetic nephropathy: Secondary | ICD-10-CM | POA: Diagnosis not present

## 2023-02-03 DIAGNOSIS — E782 Mixed hyperlipidemia: Secondary | ICD-10-CM

## 2023-02-03 DIAGNOSIS — E119 Type 2 diabetes mellitus without complications: Secondary | ICD-10-CM | POA: Diagnosis not present

## 2023-02-04 LAB — LIPID PANEL
Chol/HDL Ratio: 5.9 ratio — ABNORMAL HIGH (ref 0.0–4.4)
Cholesterol, Total: 141 mg/dL (ref 100–199)
HDL: 24 mg/dL — ABNORMAL LOW (ref >39)
LDL Chol Calc (NIH): 79 mg/dL (ref 0–99)
Triglycerides: 226 mg/dL — ABNORMAL HIGH (ref 0–149)
VLDL Cholesterol Cal: 38 mg/dL (ref 5–40)

## 2023-02-04 LAB — HEMOGLOBIN A1C
Est. average glucose Bld gHb Est-mCnc: 126 mg/dL
Hgb A1c MFr Bld: 6 % — ABNORMAL HIGH (ref 4.8–5.6)

## 2023-02-04 LAB — CARDIOVASCULAR RISK ASSESSMENT

## 2023-02-04 LAB — TSH: TSH: 1.53 u[IU]/mL (ref 0.450–4.500)

## 2023-02-06 ENCOUNTER — Other Ambulatory Visit: Payer: Self-pay

## 2023-02-06 MED ORDER — OMEGA-3-ACID ETHYL ESTERS 1 G PO CAPS: 2.0000 g | ORAL_CAPSULE | Freq: Two times a day (BID) | ORAL | 0 refills | Status: DC

## 2023-02-09 ENCOUNTER — Other Ambulatory Visit: Payer: Self-pay | Admitting: Family Medicine

## 2023-02-09 DIAGNOSIS — N1831 Chronic kidney disease, stage 3a: Secondary | ICD-10-CM

## 2023-02-09 DIAGNOSIS — E1159 Type 2 diabetes mellitus with other circulatory complications: Secondary | ICD-10-CM

## 2023-02-11 DIAGNOSIS — Z5111 Encounter for antineoplastic chemotherapy: Secondary | ICD-10-CM | POA: Diagnosis not present

## 2023-02-11 DIAGNOSIS — C787 Secondary malignant neoplasm of liver and intrahepatic bile duct: Secondary | ICD-10-CM | POA: Diagnosis not present

## 2023-02-11 DIAGNOSIS — T829XXA Unspecified complication of cardiac and vascular prosthetic device, implant and graft, initial encounter: Secondary | ICD-10-CM | POA: Diagnosis not present

## 2023-02-11 DIAGNOSIS — C480 Malignant neoplasm of retroperitoneum: Secondary | ICD-10-CM | POA: Diagnosis not present

## 2023-02-11 DIAGNOSIS — R918 Other nonspecific abnormal finding of lung field: Secondary | ICD-10-CM | POA: Diagnosis not present

## 2023-02-18 DIAGNOSIS — R918 Other nonspecific abnormal finding of lung field: Secondary | ICD-10-CM | POA: Diagnosis not present

## 2023-02-18 DIAGNOSIS — C480 Malignant neoplasm of retroperitoneum: Secondary | ICD-10-CM | POA: Diagnosis not present

## 2023-02-18 DIAGNOSIS — C787 Secondary malignant neoplasm of liver and intrahepatic bile duct: Secondary | ICD-10-CM | POA: Diagnosis not present

## 2023-02-24 ENCOUNTER — Other Ambulatory Visit: Payer: Self-pay | Admitting: Family Medicine

## 2023-02-25 DIAGNOSIS — C480 Malignant neoplasm of retroperitoneum: Secondary | ICD-10-CM | POA: Diagnosis not present

## 2023-02-25 DIAGNOSIS — Z5111 Encounter for antineoplastic chemotherapy: Secondary | ICD-10-CM | POA: Diagnosis not present

## 2023-02-25 DIAGNOSIS — T829XXA Unspecified complication of cardiac and vascular prosthetic device, implant and graft, initial encounter: Secondary | ICD-10-CM | POA: Diagnosis not present

## 2023-02-25 DIAGNOSIS — R918 Other nonspecific abnormal finding of lung field: Secondary | ICD-10-CM | POA: Diagnosis not present

## 2023-02-25 DIAGNOSIS — C787 Secondary malignant neoplasm of liver and intrahepatic bile duct: Secondary | ICD-10-CM | POA: Diagnosis not present

## 2023-02-26 DIAGNOSIS — R918 Other nonspecific abnormal finding of lung field: Secondary | ICD-10-CM | POA: Diagnosis not present

## 2023-02-26 DIAGNOSIS — C787 Secondary malignant neoplasm of liver and intrahepatic bile duct: Secondary | ICD-10-CM | POA: Diagnosis not present

## 2023-03-11 DIAGNOSIS — R918 Other nonspecific abnormal finding of lung field: Secondary | ICD-10-CM | POA: Diagnosis not present

## 2023-03-11 DIAGNOSIS — C499 Malignant neoplasm of connective and soft tissue, unspecified: Secondary | ICD-10-CM | POA: Diagnosis not present

## 2023-03-11 DIAGNOSIS — C480 Malignant neoplasm of retroperitoneum: Secondary | ICD-10-CM | POA: Diagnosis not present

## 2023-03-11 DIAGNOSIS — C787 Secondary malignant neoplasm of liver and intrahepatic bile duct: Secondary | ICD-10-CM | POA: Diagnosis not present

## 2023-03-11 DIAGNOSIS — Z5111 Encounter for antineoplastic chemotherapy: Secondary | ICD-10-CM | POA: Diagnosis not present

## 2023-03-11 DIAGNOSIS — C786 Secondary malignant neoplasm of retroperitoneum and peritoneum: Secondary | ICD-10-CM | POA: Diagnosis not present

## 2023-03-18 DIAGNOSIS — C787 Secondary malignant neoplasm of liver and intrahepatic bile duct: Secondary | ICD-10-CM | POA: Diagnosis not present

## 2023-03-18 DIAGNOSIS — R918 Other nonspecific abnormal finding of lung field: Secondary | ICD-10-CM | POA: Diagnosis not present

## 2023-03-18 DIAGNOSIS — C480 Malignant neoplasm of retroperitoneum: Secondary | ICD-10-CM | POA: Diagnosis not present

## 2023-03-18 DIAGNOSIS — C786 Secondary malignant neoplasm of retroperitoneum and peritoneum: Secondary | ICD-10-CM | POA: Diagnosis not present

## 2023-03-18 DIAGNOSIS — C499 Malignant neoplasm of connective and soft tissue, unspecified: Secondary | ICD-10-CM | POA: Diagnosis not present

## 2023-03-18 DIAGNOSIS — Z5111 Encounter for antineoplastic chemotherapy: Secondary | ICD-10-CM | POA: Diagnosis not present

## 2023-03-25 ENCOUNTER — Other Ambulatory Visit: Payer: Self-pay | Admitting: Family Medicine

## 2023-03-25 DIAGNOSIS — C787 Secondary malignant neoplasm of liver and intrahepatic bile duct: Secondary | ICD-10-CM | POA: Diagnosis not present

## 2023-03-25 DIAGNOSIS — Z5111 Encounter for antineoplastic chemotherapy: Secondary | ICD-10-CM | POA: Diagnosis not present

## 2023-03-25 DIAGNOSIS — C786 Secondary malignant neoplasm of retroperitoneum and peritoneum: Secondary | ICD-10-CM | POA: Diagnosis not present

## 2023-03-25 DIAGNOSIS — R918 Other nonspecific abnormal finding of lung field: Secondary | ICD-10-CM | POA: Diagnosis not present

## 2023-03-25 DIAGNOSIS — C480 Malignant neoplasm of retroperitoneum: Secondary | ICD-10-CM | POA: Diagnosis not present

## 2023-03-25 DIAGNOSIS — C499 Malignant neoplasm of connective and soft tissue, unspecified: Secondary | ICD-10-CM | POA: Diagnosis not present

## 2023-03-26 DIAGNOSIS — R918 Other nonspecific abnormal finding of lung field: Secondary | ICD-10-CM | POA: Diagnosis not present

## 2023-03-26 DIAGNOSIS — C787 Secondary malignant neoplasm of liver and intrahepatic bile duct: Secondary | ICD-10-CM | POA: Diagnosis not present

## 2023-03-26 DIAGNOSIS — C480 Malignant neoplasm of retroperitoneum: Secondary | ICD-10-CM | POA: Diagnosis not present

## 2023-04-01 ENCOUNTER — Inpatient Hospital Stay: Payer: Medicare PPO

## 2023-04-01 ENCOUNTER — Inpatient Hospital Stay: Payer: Medicare PPO | Admitting: Oncology

## 2023-04-08 DIAGNOSIS — C480 Malignant neoplasm of retroperitoneum: Secondary | ICD-10-CM | POA: Diagnosis not present

## 2023-04-08 DIAGNOSIS — C787 Secondary malignant neoplasm of liver and intrahepatic bile duct: Secondary | ICD-10-CM | POA: Diagnosis not present

## 2023-04-08 DIAGNOSIS — R918 Other nonspecific abnormal finding of lung field: Secondary | ICD-10-CM | POA: Diagnosis not present

## 2023-04-08 DIAGNOSIS — Z5111 Encounter for antineoplastic chemotherapy: Secondary | ICD-10-CM | POA: Diagnosis not present

## 2023-04-13 ENCOUNTER — Other Ambulatory Visit: Payer: Self-pay | Admitting: Oncology

## 2023-04-13 DIAGNOSIS — D631 Anemia in chronic kidney disease: Secondary | ICD-10-CM

## 2023-04-13 NOTE — Progress Notes (Unsigned)
[facility]  [address] [place],  [place]  [address] [phone]  Clinic Day:  04/13/2023  Referring physician: Blane Ohara, MD  HISTORY OF PRESENT ILLNESS:  The patient is a 82 y.o. 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  As her hemoglobin has consistently held above 10 before last few visits, her anemia has been followed conservatively.  She comes in today for routine follow-up.  Unfortunately, since her last visit, scans have not shown metastatic leiomyosarcoma, including spread of disease to her liver and lungs.  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  However, as she now has metastatic disease, she is now on palliative chemotherapy consisting of docetaxel/gemcitabine.  The patient claims to be tolerating this regimen fairly well.  However, she has had pruritus and skin rash.  She understands she is ultimately dealing with incurable disease, with the goal of palliative chemotherapy being to keep her disease under control for as long as possible.  PHYSICAL EXAM:  There were no vitals taken for this visit. Wt Readings from Last 3 Encounters:  01/27/23 164 lb (74.4 kg)  09/30/22 179 lb 4.8 oz (81.3 kg)  08/20/22 179 lb 9.6 oz (81.5 kg)   There is no height or weight on file to calculate BMI. Performance status (ECOG): ]1 Physical Exam Constitutional:      Appearance: Normal appearance. She is not ill-appearing.  HENT:     Mouth/Throat:     Mouth: Mucous membranes are moist.     Pharynx: Oropharynx is clear. No oropharyngeal exudate or posterior oropharyngeal erythema.  Cardiovascular:     Rate and Rhythm: Normal rate and regular rhythm.     Heart sounds: No murmur heard.    No friction rub. No gallop.   Pulmonary:     Effort: Pulmonary effort is normal. No respiratory distress.     Breath sounds: Normal breath sounds. No wheezing, rhonchi or rales.  Abdominal:     General: Bowel sounds are normal. There is no distension.     Palpations: Abdomen is soft. There is no mass.     Tenderness: There is no abdominal tenderness.  Musculoskeletal:        General: No swelling.     Right lower leg: No edema.     Left lower leg: No edema.  Lymphadenopathy:     Cervical: No cervical adenopathy.     Upper Body:     Right upper body: No supraclavicular or axillary adenopathy.     Left upper body: No supraclavicular or axillary adenopathy.     Lower Body: No right inguinal adenopathy. No left inguinal adenopathy.  Skin:    General: Skin is warm.     Coloration: Skin is not jaundiced.     Findings: No lesion or rash.  Neurological:     General: No focal deficit present.     Mental Status: She is alert and oriented to person, place, and time. Mental status is at baseline.  Psychiatric:        Mood and Affect: Mood normal.        Behavior: Behavior normal.        Thought Content: Thought content normal.    LABS:    ASSESSMENT & PLAN:  Assessment/Plan:  A 82 y.o.  female with a history of mild anemia secondary to iron deficiency and mild renal insufficiency.  When evaluating her labs today, her hemoglobin 10.4 is lower than what it has been previously.  However, as mentioned, she is now taking docetaxel/gemcitabine for metastatic leiomyosarcoma.  This is likely the culprit behind her progressive anemia.  She is also likely receiving white cell shot therapy with her chemotherapy;  this would likely explain her mildly elevated white count.  From a hematologic standpoint, the patient is doing fine.  She knows to contact our office over these next few months patient if she runs into any problems with her palliative chemotherapy to where some form of supportive could be given more locally.  Otherwise, I  will see her back in 6 months for repeat clinical assessment.  The patient understands all the plans discussed today and is in agreement with them.      Harlee Eckroth Kirby Funk, MD

## 2023-04-14 ENCOUNTER — Inpatient Hospital Stay: Payer: Medicare PPO | Attending: Oncology

## 2023-04-14 ENCOUNTER — Inpatient Hospital Stay: Payer: Medicare PPO | Admitting: Oncology

## 2023-04-14 VITALS — BP 153/70 | HR 66 | Temp 99.0°F | Resp 16 | Ht 68.0 in | Wt 165.0 lb

## 2023-04-14 DIAGNOSIS — D509 Iron deficiency anemia, unspecified: Secondary | ICD-10-CM | POA: Insufficient documentation

## 2023-04-14 DIAGNOSIS — C480 Malignant neoplasm of retroperitoneum: Secondary | ICD-10-CM | POA: Diagnosis not present

## 2023-04-14 DIAGNOSIS — J984 Other disorders of lung: Secondary | ICD-10-CM | POA: Diagnosis not present

## 2023-04-14 DIAGNOSIS — D649 Anemia, unspecified: Secondary | ICD-10-CM | POA: Diagnosis not present

## 2023-04-14 DIAGNOSIS — D631 Anemia in chronic kidney disease: Secondary | ICD-10-CM

## 2023-04-14 DIAGNOSIS — N2889 Other specified disorders of kidney and ureter: Secondary | ICD-10-CM | POA: Diagnosis not present

## 2023-04-14 DIAGNOSIS — D696 Thrombocytopenia, unspecified: Secondary | ICD-10-CM | POA: Diagnosis not present

## 2023-04-14 DIAGNOSIS — N189 Chronic kidney disease, unspecified: Secondary | ICD-10-CM | POA: Diagnosis not present

## 2023-04-14 LAB — CBC AND DIFFERENTIAL
HCT: 29 — AB (ref 36–46)
Hemoglobin: 9.6 — AB (ref 12.0–16.0)
Neutrophils Absolute: 1.08
Platelets: 213 10*3/uL (ref 150–400)
WBC: 2

## 2023-04-14 LAB — CMP (CANCER CENTER ONLY)
ALT: 55 U/L — ABNORMAL HIGH (ref 0–44)
AST: 45 U/L — ABNORMAL HIGH (ref 15–41)
Albumin: 4 g/dL (ref 3.5–5.0)
Alkaline Phosphatase: 107 U/L (ref 38–126)
Anion gap: 10 (ref 5–15)
BUN: 16 mg/dL (ref 8–23)
CO2: 26 mmol/L (ref 22–32)
Calcium: 9.5 mg/dL (ref 8.9–10.3)
Chloride: 104 mmol/L (ref 98–111)
Creatinine: 1.37 mg/dL — ABNORMAL HIGH (ref 0.44–1.00)
GFR, Estimated: 39 mL/min — ABNORMAL LOW (ref >60)
Glucose, Bld: 114 mg/dL — ABNORMAL HIGH (ref 70–99)
Potassium: 4.1 mmol/L (ref 3.5–5.1)
Sodium: 140 mmol/L (ref 135–145)
Total Bilirubin: 0.7 mg/dL (ref 0.3–1.2)
Total Protein: 6.7 g/dL (ref 6.5–8.1)

## 2023-04-14 LAB — IRON AND TIBC
Iron: 48 ug/dL (ref 28–170)
Saturation Ratios: 14 % (ref 10.4–31.8)
TIBC: 342 ug/dL (ref 250–450)
UIBC: 294 ug/dL

## 2023-04-14 LAB — FERRITIN: Ferritin: 174 ng/mL (ref 11–307)

## 2023-04-14 LAB — CBC: RBC: 3.26 — AB (ref 3.87–5.11)

## 2023-04-20 ENCOUNTER — Other Ambulatory Visit: Payer: Self-pay | Admitting: Family Medicine

## 2023-04-20 DIAGNOSIS — Z809 Family history of malignant neoplasm, unspecified: Secondary | ICD-10-CM | POA: Diagnosis not present

## 2023-04-20 DIAGNOSIS — C3490 Malignant neoplasm of unspecified part of unspecified bronchus or lung: Secondary | ICD-10-CM | POA: Diagnosis not present

## 2023-04-20 DIAGNOSIS — E039 Hypothyroidism, unspecified: Secondary | ICD-10-CM | POA: Diagnosis not present

## 2023-04-20 DIAGNOSIS — K5900 Constipation, unspecified: Secondary | ICD-10-CM | POA: Diagnosis not present

## 2023-04-20 DIAGNOSIS — J439 Emphysema, unspecified: Secondary | ICD-10-CM | POA: Diagnosis not present

## 2023-04-20 DIAGNOSIS — R011 Cardiac murmur, unspecified: Secondary | ICD-10-CM | POA: Diagnosis not present

## 2023-04-20 DIAGNOSIS — H9193 Unspecified hearing loss, bilateral: Secondary | ICD-10-CM | POA: Diagnosis not present

## 2023-04-20 DIAGNOSIS — I129 Hypertensive chronic kidney disease with stage 1 through stage 4 chronic kidney disease, or unspecified chronic kidney disease: Secondary | ICD-10-CM | POA: Diagnosis not present

## 2023-04-20 DIAGNOSIS — G4700 Insomnia, unspecified: Secondary | ICD-10-CM | POA: Diagnosis not present

## 2023-04-20 DIAGNOSIS — Z8249 Family history of ischemic heart disease and other diseases of the circulatory system: Secondary | ICD-10-CM | POA: Diagnosis not present

## 2023-04-20 DIAGNOSIS — M1990 Unspecified osteoarthritis, unspecified site: Secondary | ICD-10-CM | POA: Diagnosis not present

## 2023-04-20 DIAGNOSIS — K219 Gastro-esophageal reflux disease without esophagitis: Secondary | ICD-10-CM | POA: Diagnosis not present

## 2023-04-20 DIAGNOSIS — D696 Thrombocytopenia, unspecified: Secondary | ICD-10-CM | POA: Diagnosis not present

## 2023-04-20 DIAGNOSIS — E1142 Type 2 diabetes mellitus with diabetic polyneuropathy: Secondary | ICD-10-CM | POA: Diagnosis not present

## 2023-04-20 DIAGNOSIS — D84821 Immunodeficiency due to drugs: Secondary | ICD-10-CM | POA: Diagnosis not present

## 2023-04-20 DIAGNOSIS — I2510 Atherosclerotic heart disease of native coronary artery without angina pectoris: Secondary | ICD-10-CM | POA: Diagnosis not present

## 2023-04-20 DIAGNOSIS — J302 Other seasonal allergic rhinitis: Secondary | ICD-10-CM | POA: Diagnosis not present

## 2023-04-20 DIAGNOSIS — E785 Hyperlipidemia, unspecified: Secondary | ICD-10-CM | POA: Diagnosis not present

## 2023-04-21 DIAGNOSIS — C7889 Secondary malignant neoplasm of other digestive organs: Secondary | ICD-10-CM | POA: Diagnosis not present

## 2023-04-21 DIAGNOSIS — C7989 Secondary malignant neoplasm of other specified sites: Secondary | ICD-10-CM | POA: Diagnosis not present

## 2023-04-21 DIAGNOSIS — C480 Malignant neoplasm of retroperitoneum: Secondary | ICD-10-CM | POA: Diagnosis not present

## 2023-04-21 DIAGNOSIS — Z9889 Other specified postprocedural states: Secondary | ICD-10-CM | POA: Diagnosis not present

## 2023-04-21 DIAGNOSIS — C7801 Secondary malignant neoplasm of right lung: Secondary | ICD-10-CM | POA: Diagnosis not present

## 2023-04-21 DIAGNOSIS — R590 Localized enlarged lymph nodes: Secondary | ICD-10-CM | POA: Diagnosis not present

## 2023-04-21 DIAGNOSIS — R918 Other nonspecific abnormal finding of lung field: Secondary | ICD-10-CM | POA: Diagnosis not present

## 2023-04-21 DIAGNOSIS — C787 Secondary malignant neoplasm of liver and intrahepatic bile duct: Secondary | ICD-10-CM | POA: Diagnosis not present

## 2023-04-21 DIAGNOSIS — C7802 Secondary malignant neoplasm of left lung: Secondary | ICD-10-CM | POA: Diagnosis not present

## 2023-04-21 DIAGNOSIS — C772 Secondary and unspecified malignant neoplasm of intra-abdominal lymph nodes: Secondary | ICD-10-CM | POA: Diagnosis not present

## 2023-04-22 DIAGNOSIS — Z5111 Encounter for antineoplastic chemotherapy: Secondary | ICD-10-CM | POA: Diagnosis not present

## 2023-04-22 DIAGNOSIS — C480 Malignant neoplasm of retroperitoneum: Secondary | ICD-10-CM | POA: Diagnosis not present

## 2023-04-22 DIAGNOSIS — R918 Other nonspecific abnormal finding of lung field: Secondary | ICD-10-CM | POA: Diagnosis not present

## 2023-04-22 DIAGNOSIS — C787 Secondary malignant neoplasm of liver and intrahepatic bile duct: Secondary | ICD-10-CM | POA: Diagnosis not present

## 2023-04-23 DIAGNOSIS — C787 Secondary malignant neoplasm of liver and intrahepatic bile duct: Secondary | ICD-10-CM | POA: Diagnosis not present

## 2023-04-23 DIAGNOSIS — R918 Other nonspecific abnormal finding of lung field: Secondary | ICD-10-CM | POA: Diagnosis not present

## 2023-04-25 ENCOUNTER — Other Ambulatory Visit: Payer: Self-pay | Admitting: Family Medicine

## 2023-05-06 DIAGNOSIS — Z5111 Encounter for antineoplastic chemotherapy: Secondary | ICD-10-CM | POA: Diagnosis not present

## 2023-05-06 DIAGNOSIS — R918 Other nonspecific abnormal finding of lung field: Secondary | ICD-10-CM | POA: Diagnosis not present

## 2023-05-06 DIAGNOSIS — C787 Secondary malignant neoplasm of liver and intrahepatic bile duct: Secondary | ICD-10-CM | POA: Diagnosis not present

## 2023-05-06 DIAGNOSIS — C480 Malignant neoplasm of retroperitoneum: Secondary | ICD-10-CM | POA: Diagnosis not present

## 2023-05-10 ENCOUNTER — Other Ambulatory Visit: Payer: Self-pay | Admitting: Family Medicine

## 2023-05-13 ENCOUNTER — Other Ambulatory Visit: Payer: Self-pay | Admitting: Family Medicine

## 2023-05-13 DIAGNOSIS — R918 Other nonspecific abnormal finding of lung field: Secondary | ICD-10-CM | POA: Diagnosis not present

## 2023-05-13 DIAGNOSIS — C787 Secondary malignant neoplasm of liver and intrahepatic bile duct: Secondary | ICD-10-CM | POA: Diagnosis not present

## 2023-05-13 DIAGNOSIS — C480 Malignant neoplasm of retroperitoneum: Secondary | ICD-10-CM | POA: Diagnosis not present

## 2023-05-20 DIAGNOSIS — R918 Other nonspecific abnormal finding of lung field: Secondary | ICD-10-CM | POA: Diagnosis not present

## 2023-05-20 DIAGNOSIS — C787 Secondary malignant neoplasm of liver and intrahepatic bile duct: Secondary | ICD-10-CM | POA: Diagnosis not present

## 2023-05-20 DIAGNOSIS — C480 Malignant neoplasm of retroperitoneum: Secondary | ICD-10-CM | POA: Diagnosis not present

## 2023-05-20 DIAGNOSIS — Z5111 Encounter for antineoplastic chemotherapy: Secondary | ICD-10-CM | POA: Diagnosis not present

## 2023-05-21 DIAGNOSIS — R918 Other nonspecific abnormal finding of lung field: Secondary | ICD-10-CM | POA: Diagnosis not present

## 2023-05-21 DIAGNOSIS — C787 Secondary malignant neoplasm of liver and intrahepatic bile duct: Secondary | ICD-10-CM | POA: Diagnosis not present

## 2023-06-03 DIAGNOSIS — C480 Malignant neoplasm of retroperitoneum: Secondary | ICD-10-CM | POA: Diagnosis not present

## 2023-06-03 DIAGNOSIS — C787 Secondary malignant neoplasm of liver and intrahepatic bile duct: Secondary | ICD-10-CM | POA: Diagnosis not present

## 2023-06-03 DIAGNOSIS — Z5111 Encounter for antineoplastic chemotherapy: Secondary | ICD-10-CM | POA: Diagnosis not present

## 2023-06-03 DIAGNOSIS — R918 Other nonspecific abnormal finding of lung field: Secondary | ICD-10-CM | POA: Diagnosis not present

## 2023-06-08 ENCOUNTER — Other Ambulatory Visit: Payer: Self-pay | Admitting: Family Medicine

## 2023-06-09 DIAGNOSIS — R918 Other nonspecific abnormal finding of lung field: Secondary | ICD-10-CM | POA: Diagnosis not present

## 2023-06-09 DIAGNOSIS — C787 Secondary malignant neoplasm of liver and intrahepatic bile duct: Secondary | ICD-10-CM | POA: Diagnosis not present

## 2023-06-10 ENCOUNTER — Other Ambulatory Visit: Payer: Self-pay | Admitting: Family Medicine

## 2023-06-10 DIAGNOSIS — R918 Other nonspecific abnormal finding of lung field: Secondary | ICD-10-CM | POA: Diagnosis not present

## 2023-06-10 DIAGNOSIS — Z5111 Encounter for antineoplastic chemotherapy: Secondary | ICD-10-CM | POA: Diagnosis not present

## 2023-06-10 DIAGNOSIS — C787 Secondary malignant neoplasm of liver and intrahepatic bile duct: Secondary | ICD-10-CM | POA: Diagnosis not present

## 2023-06-10 DIAGNOSIS — C480 Malignant neoplasm of retroperitoneum: Secondary | ICD-10-CM | POA: Diagnosis not present

## 2023-06-11 DIAGNOSIS — R918 Other nonspecific abnormal finding of lung field: Secondary | ICD-10-CM | POA: Diagnosis not present

## 2023-06-11 DIAGNOSIS — C787 Secondary malignant neoplasm of liver and intrahepatic bile duct: Secondary | ICD-10-CM | POA: Diagnosis not present

## 2023-06-11 DIAGNOSIS — C480 Malignant neoplasm of retroperitoneum: Secondary | ICD-10-CM | POA: Diagnosis not present

## 2023-06-24 DIAGNOSIS — R918 Other nonspecific abnormal finding of lung field: Secondary | ICD-10-CM | POA: Diagnosis not present

## 2023-06-24 DIAGNOSIS — C7800 Secondary malignant neoplasm of unspecified lung: Secondary | ICD-10-CM | POA: Diagnosis not present

## 2023-06-24 DIAGNOSIS — C7889 Secondary malignant neoplasm of other digestive organs: Secondary | ICD-10-CM | POA: Diagnosis not present

## 2023-06-24 DIAGNOSIS — C787 Secondary malignant neoplasm of liver and intrahepatic bile duct: Secondary | ICD-10-CM | POA: Diagnosis not present

## 2023-06-28 ENCOUNTER — Other Ambulatory Visit: Payer: Self-pay | Admitting: Family Medicine

## 2023-07-01 DIAGNOSIS — C480 Malignant neoplasm of retroperitoneum: Secondary | ICD-10-CM | POA: Diagnosis not present

## 2023-07-01 DIAGNOSIS — C787 Secondary malignant neoplasm of liver and intrahepatic bile duct: Secondary | ICD-10-CM | POA: Diagnosis not present

## 2023-07-01 DIAGNOSIS — R918 Other nonspecific abnormal finding of lung field: Secondary | ICD-10-CM | POA: Diagnosis not present

## 2023-07-01 DIAGNOSIS — Z5111 Encounter for antineoplastic chemotherapy: Secondary | ICD-10-CM | POA: Diagnosis not present

## 2023-07-17 ENCOUNTER — Telehealth: Payer: Self-pay

## 2023-07-17 DIAGNOSIS — C480 Malignant neoplasm of retroperitoneum: Secondary | ICD-10-CM | POA: Diagnosis not present

## 2023-07-17 DIAGNOSIS — R918 Other nonspecific abnormal finding of lung field: Secondary | ICD-10-CM | POA: Diagnosis not present

## 2023-07-17 NOTE — Telephone Encounter (Signed)
Contacted patient on preferred number listed in notes for scheduled AWV. Patient stated unable to do visit today will call back to reschedule.

## 2023-07-27 ENCOUNTER — Other Ambulatory Visit: Payer: Self-pay | Admitting: Family Medicine

## 2023-07-27 NOTE — Progress Notes (Unsigned)
Subjective:  Patient ID: Katherine Richmond, female    DOB: 15-Feb-1941  Age: 82 y.o. MRN: [id number]  Chief Complaint  Patient presents with  . Medical Management of Chronic Issues    HPI Diabetes:  Complications: Hyperlipidemia, Hypertension. Glucose checking: not checking. Most recent A1C: 6.0% Current medications: Metformin 1000 mg daily. Last Eye Exam: [facility] in March 2024.  Foot checks: daily   Hyperlipidemia: Current medications: Atorvastatin 40 mg daily, Omega-3 1000 mg twice a day. Eating healthy. exercising   Hypertension: Complications: Diabetes, Hypertension. Current medications: Irbesartan-Hctz 300-12.5 mg daily, Metoprolol 100 mg daily, Aspirin 81 mg daily.   Hypothyroidism: She is taking Levothyroxine 50 mcg daily.   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 Has been on chemo since last October 2023. Patient is on a break from chemo. Lost 25 lbs.  Has a lot of side effects.      07/28/2023    8:44 AM 01/27/2023   10:19 AM 06/19/2021    4:02 PM 02/18/2021    4:29 PM 12/21/2019    4:12 PM  Depression screen PHQ 2/9  Decreased Interest 1 0 0 0 0  Down, Depressed, Hopeless 0 0 0 0 0  PHQ - 2 Score 1 0 0 0 0  Altered sleeping 2      Tired, decreased energy 1      Change in appetite 0      Feeling bad or failure about yourself  0      Trouble concentrating 0      Moving slowly or fidgety/restless 0      Suicidal thoughts 0      PHQ-9 Score 4      Difficult doing work/chores Not difficult at all            07/28/2023    8:43 AM  Fall Risk   Falls in the past year? 0  Number falls in past yr: 0  Injury with Fall? 0  Risk for fall due to : No Fall Risks  Follow up Falls evaluation completed;Falls prevention discussed    Patient Care Team: Blane Ohara, MD as PCP - General (Family Medicine) Deveron Furlong, MD as Surgical Oncologist (Oncology) Weston Settle, MD as Consulting Physician (Oncology) Earvin Hansen, [facility] (Inactive)  as Pharmacist (Pharmacist) Marylen Ponto, DO (Obstetrics and Gynecology)   Review of Systems  Constitutional:  Positive for fatigue. Negative for chills and fever.  HENT:  Positive for rhinorrhea. Negative for congestion and sore throat.   Respiratory:  Negative for cough and shortness of breath.   Cardiovascular:  Negative for chest pain and palpitations.  Gastrointestinal:  Negative for abdominal pain, constipation, diarrhea, nausea and vomiting.  Genitourinary:  Negative for dysuria and urgency.  Musculoskeletal:  Positive for arthralgias (left shoulder pain) and back pain (left arm radiculopathy). Negative for myalgias.  Neurological:  Positive for numbness (tingling left shoulder, arm and hand). Negative for dizziness, weakness, light-headedness and headaches.  Psychiatric/Behavioral:  Negative for dysphoric mood. The patient is not nervous/anxious.     Current Outpatient Medications on File Prior to Visit  Medication Sig Dispense Refill  . aspirin 81 MG EC tablet Take 81 mg by mouth daily.    Marland Kitchen atorvastatin (LIPITOR) 40 MG tablet Take 1 tablet (40 mg total) by mouth daily. 90 tablet 1  . cetirizine (ZYRTEC ALLERGY) 10 MG tablet Take 1 tablet (10 mg total) by mouth at bedtime. 90 tablet 3  . ferrous sulfate 325 (65 FE)  MG tablet Take by mouth.    . fluticasone (FLONASE) 50 MCG/ACT nasal spray Place 2 sprays into both nostrils daily. 48 g 1  . gabapentin (NEURONTIN) 300 MG capsule Take 1 capsule (300 mg total) by mouth 2 (two) times daily. 180 capsule 1  . irbesartan-hydrochlorothiazide (AVALIDE) 300-12.5 MG tablet Take 1 tablet by mouth daily. 90 tablet 0  . levothyroxine (SYNTHROID) 50 MCG tablet TAKE ONE TABLET BY MOUTH DAILY 90 tablet 3  . metFORMIN (GLUCOPHAGE) 1000 MG tablet Take 1 tablet by mouth daily. 90 tablet 1  . metoprolol succinate (TOPROL-XL) 100 MG 24 hr tablet Take 1 tablet (100 mg total) by mouth daily. 90 tablet 1  . Multiple Vitamins-Minerals (MULTIPLE  VITAMINS/WOMENS PO) Take by mouth.    . omega-3 acid ethyl esters (LOVAZA) 1 g capsule Take 2 capsules (2 g total) by mouth 2 (two) times daily. 360 capsule 0  . traZODone (DESYREL) 50 MG tablet Take 1 tablet (50 mg total) by mouth at bedtime. 90 tablet 1   No current facility-administered medications on file prior to visit.   Past Medical History:  Diagnosis Date  . Anemia   . Cancer (HCC) 07/2018   Leiomyosarcoma  . Chronic kidney disease    N18.3a).  . Diabetes mellitus without complication (HCC)   . Hyperlipidemia   . Hypertension   . 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 Thyroid disease    Past Surgical History:  Procedure Laterality Date  . ABDOMINAL HYSTERECTOMY    . EYE SURGERY Right 11/2016   CATARACT REMOVAL  . EYE SURGERY Left 12/2016   CATARACT REMOVAL  . FOOT SURGERY Bilateral    bunionectomies  . LAPAROSCOPIC PARTIAL HEPATECTOMY Left 11/18/2019  . 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  . TONSILLECTOMY  2002    Family History  Problem Relation Age of Onset  . Breast cancer Mother   . Colon cancer Mother   . Renal cancer Mother   . Liver cancer Half-Brother   . Multiple sclerosis Half-Brother   . Hypertension Other   . Transient ischemic attack Other   . Glaucoma Other    Social History   Socioeconomic History  . Marital status: Married    Spouse name: Clinical research associate  . Number of children: 1  . Years of education: Not on file  . Highest education level: Not on file  Occupational History  . Occupation: RETIRED  . Occupation: [employer], [employer], FRI  Tobacco Use  . Smoking status: Former    Current packs/day: 0.00    Types: Cigarettes    Quit date: 2012    Years since quitting: 12.7  . Smokeless tobacco: Never  Substance and Sexual Activity  . Alcohol use: Yes    Alcohol/week: 1.0 standard drink of alcohol    Types: 1 Glasses of wine per week     Comment: SOCIAL USE ONLY  . Drug use: Never  . Sexual activity: Not on file  Other Topics Concern  . Not on file  Social History Narrative  . Not on file   Social Determinants of Health   Financial Resource Strain: Low Risk  (01/27/2023)   Overall Financial Resource Strain (CARDIA)   . Difficulty of Paying Living Expenses: Not hard at all  Food Insecurity: No Food Insecurity (03/19/2021)   Hunger Vital Sign   . Worried About Programme researcher, broadcasting/film/video in the Last Year: Never true   .  Ran Out of Food in the Last Year: Never true  Transportation Needs: No Transportation Needs (03/19/2021)   PRAPARE - Transportation   . Lack of Transportation (Medical): No   . Lack of Transportation (Non-Medical): No  Physical Activity: Insufficiently Active (03/19/2021)   Exercise Vital Sign   . Days of Exercise per Week: 2 days   . Minutes of Exercise per Session: 30 min  Stress: No Stress Concern Present (01/27/2023)   [employer] of Occupational Health - Occupational Stress Questionnaire   . Feeling of Stress : Not at all  Social Connections: Moderately Isolated (01/27/2023)   Social Connection and Isolation Panel [NHANES]   . Frequency of Communication with Friends and Family: Three times a week   . Frequency of Social Gatherings with Friends and Family: Three times a week   . Attends Religious Services: Never   . Active Member of Clubs or Organizations: No   . Attends Banker Meetings: Never   . Marital Status: Married    Objective:  BP (!) 144/76   Pulse 72   Temp (!) 97.1 F (36.2 C)   Resp 16   Ht 5\' 8"  (1.727 m)   Wt 165 lb (74.8 kg)   BMI 25.09 kg/m      07/28/2023    8:52 AM 07/28/2023    8:32 AM 04/14/2023   11:56 AM  BP/Weight  Systolic BP 144 158 153  Diastolic BP 76 78 70  Wt. (Lbs)  165 165  BMI  25.09 kg/m2 25.09 kg/m2    Physical Exam Vitals reviewed.  Constitutional:      Appearance: Normal appearance. She is normal weight.  Neck:     Vascular: No  carotid bruit.  Cardiovascular:     Rate and Rhythm: Normal rate and regular rhythm.     Heart sounds: Normal heart sounds.  Pulmonary:     Effort: Pulmonary effort is normal. No respiratory distress.     Breath sounds: Normal breath sounds.  Abdominal:     General: Abdomen is flat. Bowel sounds are normal.     Palpations: Abdomen is soft.     Tenderness: There is no abdominal tenderness.  Musculoskeletal:        General: Tenderness (inferior to left scapula. negative tinels and phalens BL. good left radial pulses. negative SLR left.) present.  Neurological:     Mental Status: She is alert and oriented to person, place, and time.  Psychiatric:        Mood and Affect: Mood normal.        Behavior: Behavior normal.    Diabetic Foot Exam - Simple   Simple Foot Form Diabetic Foot exam was performed with the following findings: Yes 07/28/2023  8:48 AM  Visual Inspection Sensation Testing Intact to touch and monofilament testing bilaterally: Yes Pulse Check Posterior Tibialis and Dorsalis pulse intact bilaterally: Yes Comments Thickened yellow nails      Lab Results  Component Value Date   WBC 2.0 04/14/2023   HGB 9.6 (A) 04/14/2023   HCT 29 (A) 04/14/2023   PLT 213 04/14/2023   GLUCOSE 114 (H) 04/14/2023   CHOL 141 02/03/2023   TRIG 226 (H) 02/03/2023   HDL 24 (L) 02/03/2023   LDLCALC 79 02/03/2023   ALT 55 (H) 04/14/2023   AST 45 (H) 04/14/2023   NA 140 04/14/2023   K 4.1 04/14/2023   CL 104 04/14/2023   CREATININE 1.37 (H) 04/14/2023   BUN 16 04/14/2023   CO2  26 04/14/2023   TSH 1.530 02/03/2023   HGBA1C 6.0 (H) 02/03/2023   MICROALBUR 10 08/06/2021      Assessment & Plan:    Hypertension complicating diabetes (HCC) Assessment & Plan: Well controlled.  No changes to medicines. Continue Irbesartan-Hctz 300-12.5 mg daily, Metoprolol 100 mg daily, Aspirin 81 mg daily. Continue to work on eating a healthy diet and exercise.  Ordered Labs.   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 Recommend annual eye exams. Medicines: Continue Metformin 1000 mg daily. Continue to work on eating a healthy diet and exercise.  Labs drawn today.     Orders: -     Hemoglobin A1c -     Microalbumin / creatinine urine ratio  Mixed hyperlipidemia Assessment & Plan: Well controlled.  No changes to medicines. Atorvastatin 40 mg daily, Omega-3 1000 mg twice a day.  Continue to work on eating a healthy diet and exercise.    Orders: -     Lipid panel  Encounter for immunization -     Flu Vaccine Trivalent High Dose (Fluad)  Cervical radiculopathy -     DG Cervical Spine 2 or 3 views; Future -     predniSONE; Take 6 tablets (60 mg total) by mouth daily with breakfast for 1 day, THEN 5 tablets (50 mg total) daily with breakfast for 1 day, THEN 4 tablets (40 mg total) daily with breakfast for 1 day, THEN 3 tablets (30 mg total) daily with breakfast for 1 day, THEN 2 tablets (20 mg total) daily with breakfast for 1 day, THEN 1 tablet (10 mg total) daily with breakfast for 1 day.  Dispense: 21 tablet; Refill: 0  Left sided sciatica -     predniSONE; Take 6 tablets (60 mg total) by mouth daily with breakfast for 1 day, THEN 5 tablets (50 mg total) daily with breakfast for 1 day, THEN 4 tablets (40 mg total) daily with breakfast for 1 day, THEN 3 tablets (30 mg total) daily with breakfast for 1 day, THEN 2 tablets (20 mg total) daily with breakfast for 1 day, THEN 1 tablet (10 mg total) daily with breakfast for 1 day.  Dispense: 21 tablet; Refill: 0     Meds ordered this encounter  Medications  . predniSONE (DELTASONE) 10 MG tablet    Sig: Take 6 tablets (60 mg total) by mouth daily with breakfast for 1 day, THEN 5  tablets (50 mg total) daily with breakfast for 1 day, THEN 4 tablets (40 mg total) daily with breakfast for 1 day, THEN 3 tablets (30 mg total) daily with breakfast for 1 day, THEN 2 tablets (20 mg total) daily with breakfast for 1 day, THEN 1 tablet (10 mg total) daily with breakfast for 1 day.    Dispense:  21 tablet    Refill:  0    Orders Placed This Encounter  Procedures  . DG Cervical Spine 2 or 3 views  . Flu Vaccine Trivalent High Dose (Fluad)  . CBC with Differential/Platelet  . Comprehensive metabolic panel  . Hemoglobin A1c  . Lipid panel  . Microalbumin / creatinine urine ratio     Follow-up: Return in about 4 months (around 11/27/2023) for chronic fasting.   I,Marla I Leal-Borjas,acting as a scribe for  Blane Ohara, MD.,have documented all relevant documentation on the behalf of Blane Ohara, MD,as directed by  Blane Ohara, MD while in the presence of Blane Ohara, MD.   An After Visit Summary was printed and given to the patient.  Blane Ohara, MD [facility] [phone]

## 2023-07-27 NOTE — Assessment & Plan Note (Signed)
Well controlled.  No changes to medicines. Continue Irbesartan-Hctz 300-12.5 mg daily, Metoprolol 100 mg daily, Aspirin 81 mg daily. Continue to work on eating a healthy diet and exercise.  Ordered Labs.

## 2023-07-27 NOTE — Assessment & Plan Note (Signed)
Previously well controlled Continue Synthroid at current dose  Recheck TSH and adjust Synthroid as indicated   

## 2023-07-27 NOTE — Assessment & Plan Note (Signed)
Control: good Recommend check feet daily. Recommend annual eye exams. Medicines: Continue Metformin 1000 mg daily. Continue to work on eating a healthy diet and exercise.  Labs drawn today.

## 2023-07-27 NOTE — Assessment & Plan Note (Signed)
Well controlled.  No changes to medicines. Atorvastatin 40 mg daily, Omega-3 1000 mg twice a day.  Continue to work on eating a healthy diet and exercise.

## 2023-07-28 ENCOUNTER — Encounter: Payer: Self-pay | Admitting: Family Medicine

## 2023-07-28 ENCOUNTER — Ambulatory Visit: Payer: Medicare PPO | Admitting: Family Medicine

## 2023-07-28 VITALS — BP 144/76 | HR 72 | Temp 97.1°F | Resp 16 | Ht 68.0 in | Wt 165.0 lb

## 2023-07-28 DIAGNOSIS — E038 Other specified hypothyroidism: Secondary | ICD-10-CM

## 2023-07-28 DIAGNOSIS — E782 Mixed hyperlipidemia: Secondary | ICD-10-CM

## 2023-07-28 DIAGNOSIS — M5432 Sciatica, left side: Secondary | ICD-10-CM | POA: Diagnosis not present

## 2023-07-28 DIAGNOSIS — M5412 Radiculopathy, cervical region: Secondary | ICD-10-CM | POA: Diagnosis not present

## 2023-07-28 DIAGNOSIS — Z23 Encounter for immunization: Secondary | ICD-10-CM | POA: Diagnosis not present

## 2023-07-28 DIAGNOSIS — E1121 Type 2 diabetes mellitus with diabetic nephropathy: Secondary | ICD-10-CM

## 2023-07-28 DIAGNOSIS — I152 Hypertension secondary to endocrine disorders: Secondary | ICD-10-CM

## 2023-07-28 DIAGNOSIS — E1159 Type 2 diabetes mellitus with other circulatory complications: Secondary | ICD-10-CM

## 2023-07-28 MED ORDER — PREDNISONE 10 MG PO TABS
ORAL_TABLET | ORAL | 0 refills | Status: AC
Start: 2023-07-28 — End: 2023-08-02

## 2023-07-28 NOTE — Patient Instructions (Signed)
Predpack sent for cervical radiculopathy and left sided sciatica.

## 2023-07-29 ENCOUNTER — Telehealth: Payer: Self-pay

## 2023-07-29 LAB — MICROALBUMIN / CREATININE URINE RATIO
Creatinine, Urine: 31.6 mg/dL
Microalb/Creat Ratio: 18 mg/g creat (ref 0–29)
Microalbumin, Urine: 5.8 ug/mL

## 2023-07-29 NOTE — Telephone Encounter (Signed)
Pt needs routine lab drawn, her PCP office (Dr Sedalia Muta) was unable to obtain specimen peripherally. Pt sees Dr Melvyn Neth.  I scheduled her for appt here tomorrow. Dr Sedalia Muta would like  CBC w/diff, CMP, Hgb A1c, and lipid panel. She will bring the orders in hand, and she will be fasting.

## 2023-07-30 ENCOUNTER — Inpatient Hospital Stay: Payer: Medicare PPO | Attending: Oncology

## 2023-07-30 DIAGNOSIS — C480 Malignant neoplasm of retroperitoneum: Secondary | ICD-10-CM

## 2023-07-30 DIAGNOSIS — C482 Malignant neoplasm of peritoneum, unspecified: Secondary | ICD-10-CM | POA: Insufficient documentation

## 2023-07-30 DIAGNOSIS — I152 Hypertension secondary to endocrine disorders: Secondary | ICD-10-CM | POA: Diagnosis not present

## 2023-07-30 DIAGNOSIS — E1121 Type 2 diabetes mellitus with diabetic nephropathy: Secondary | ICD-10-CM | POA: Diagnosis not present

## 2023-07-30 DIAGNOSIS — E1159 Type 2 diabetes mellitus with other circulatory complications: Secondary | ICD-10-CM | POA: Diagnosis not present

## 2023-07-30 DIAGNOSIS — Z452 Encounter for adjustment and management of vascular access device: Secondary | ICD-10-CM | POA: Insufficient documentation

## 2023-07-30 DIAGNOSIS — E782 Mixed hyperlipidemia: Secondary | ICD-10-CM | POA: Diagnosis not present

## 2023-07-30 MED ORDER — SODIUM CHLORIDE 0.9% FLUSH
10.0000 mL | INTRAVENOUS | Status: DC | PRN
  Administered 2023-07-30: 10 mL

## 2023-07-30 MED ORDER — HEPARIN SOD (PORK) LOCK FLUSH 100 UNIT/ML IV SOLN
500.0000 [IU] | Freq: Once | INTRAVENOUS | Status: AC | PRN
  Administered 2023-07-30: 500 [IU]

## 2023-07-31 LAB — CBC WITH DIFFERENTIAL/PLATELET
Basophils Absolute: 0 10*3/uL (ref 0.0–0.2)
Basos: 1 %
EOS (ABSOLUTE): 0.3 10*3/uL (ref 0.0–0.4)
Eos: 7 %
Hematocrit: 34.1 % (ref 34.0–46.6)
Hemoglobin: 11 g/dL — ABNORMAL LOW (ref 11.1–15.9)
Immature Grans (Abs): 0 10*3/uL (ref 0.0–0.1)
Immature Granulocytes: 0 %
Lymphocytes Absolute: 1.2 10*3/uL (ref 0.7–3.1)
Lymphs: 29 %
MCH: 29.6 pg (ref 26.6–33.0)
MCHC: 32.3 g/dL (ref 31.5–35.7)
MCV: 92 fL (ref 79–97)
Monocytes Absolute: 0.4 10*3/uL (ref 0.1–0.9)
Monocytes: 11 %
Neutrophils Absolute: 2.1 10*3/uL (ref 1.4–7.0)
Neutrophils: 52 %
Platelets: 171 10*3/uL (ref 150–450)
RBC: 3.72 x10E6/uL — ABNORMAL LOW (ref 3.77–5.28)
RDW: 13.1 % (ref 11.7–15.4)
WBC: 4 10*3/uL (ref 3.4–10.8)

## 2023-07-31 LAB — COMPREHENSIVE METABOLIC PANEL
ALT: 17 IU/L (ref 0–32)
AST: 33 IU/L (ref 0–40)
Albumin: 4.3 g/dL (ref 3.8–4.8)
Alkaline Phosphatase: 106 IU/L (ref 44–121)
BUN/Creatinine Ratio: 10 — ABNORMAL LOW (ref 12–28)
BUN: 12 mg/dL (ref 8–27)
Bilirubin Total: 0.3 mg/dL (ref 0.0–1.2)
CO2: 25 mmol/L (ref 20–29)
Calcium: 10 mg/dL (ref 8.7–10.3)
Chloride: 103 mmol/L (ref 96–106)
Creatinine, Ser: 1.21 mg/dL — ABNORMAL HIGH (ref 0.57–1.00)
Globulin, Total: 2.8 g/dL (ref 1.5–4.5)
Glucose: 110 mg/dL — ABNORMAL HIGH (ref 70–99)
Potassium: 3.9 mmol/L (ref 3.5–5.2)
Sodium: 140 mmol/L (ref 134–144)
Total Protein: 7.1 g/dL (ref 6.0–8.5)
eGFR: 46 mL/min/{1.73_m2} — ABNORMAL LOW (ref >59)

## 2023-07-31 LAB — HEMOGLOBIN A1C
Est. average glucose Bld gHb Est-mCnc: 117 mg/dL
Hgb A1c MFr Bld: 5.7 % — ABNORMAL HIGH (ref 4.8–5.6)

## 2023-07-31 LAB — LIPID PANEL
Chol/HDL Ratio: 3.3 ratio (ref 0.0–4.4)
Cholesterol, Total: 169 mg/dL (ref 100–199)
HDL: 51 mg/dL (ref >39)
LDL Chol Calc (NIH): 98 mg/dL (ref 0–99)
Triglycerides: 110 mg/dL (ref 0–149)
VLDL Cholesterol Cal: 20 mg/dL (ref 5–40)

## 2023-07-31 LAB — LITHOLINK CKD PROGRAM

## 2023-08-05 DIAGNOSIS — C787 Secondary malignant neoplasm of liver and intrahepatic bile duct: Secondary | ICD-10-CM | POA: Diagnosis not present

## 2023-08-05 DIAGNOSIS — R918 Other nonspecific abnormal finding of lung field: Secondary | ICD-10-CM | POA: Diagnosis not present

## 2023-08-05 DIAGNOSIS — C480 Malignant neoplasm of retroperitoneum: Secondary | ICD-10-CM | POA: Diagnosis not present

## 2023-08-24 ENCOUNTER — Other Ambulatory Visit: Payer: Self-pay | Admitting: Family Medicine

## 2023-08-24 DIAGNOSIS — N1831 Chronic kidney disease, stage 3a: Secondary | ICD-10-CM

## 2023-08-24 DIAGNOSIS — I10 Essential (primary) hypertension: Secondary | ICD-10-CM

## 2023-08-25 ENCOUNTER — Ambulatory Visit (INDEPENDENT_AMBULATORY_CARE_PROVIDER_SITE_OTHER): Payer: Medicare PPO

## 2023-08-25 VITALS — BP 138/78 | HR 58 | Temp 97.0°F | Ht 68.0 in | Wt 166.8 lb

## 2023-08-25 DIAGNOSIS — Z0000 Encounter for general adult medical examination without abnormal findings: Secondary | ICD-10-CM

## 2023-08-25 DIAGNOSIS — Z1231 Encounter for screening mammogram for malignant neoplasm of breast: Secondary | ICD-10-CM | POA: Diagnosis not present

## 2023-08-25 DIAGNOSIS — Z23 Encounter for immunization: Secondary | ICD-10-CM | POA: Diagnosis not present

## 2023-08-25 NOTE — Patient Instructions (Signed)
Katherine Richmond , Thank you for taking time to come for your Medicare Wellness Visit. I appreciate your ongoing commitment to your health goals. Please review the following plan we discussed and let me know if I can assist you in the future.   Ordered a mammogram for you Please try the pain patches Keep going and happy birthday in advance.    These are the goals we discussed:  Goals      Learn More About My Health     Timeframe:  Long-Range Goal Priority:  High Start Date:                             Expected End Date:                        Follow Up Date 03/2022   - tell my story and reason for my visit - make a list of questions - ask questions - repeat what I heard to make sure I understand - bring a list of my medicines to the visit - speak up when I don't understand    Why is this important?   The best way to learn about your health and care is by talking to the doctor and nurse.  They will answer your questions and give you information in the way that you like best.    Notes:      Manage My Medicine     Timeframe:  Long-Range Goal Priority:  High Start Date:                             Expected End Date:                       Follow Up Date 03/2022    - call for medicine refill 2 or 3 days before it runs out - keep a list of all the medicines I take; vitamins and herbals too - use a pillbox to sort medicine - use an alarm clock or phone to remind me to take my medicine    Why is this important?   These steps will help you keep on track with your medicines.   Notes:      Set My Target A1C-Diabetes Type 2     Timeframe:  Long-Range Goal Priority:  High Start Date:                             Expected End Date:                       Follow Up Date 03/2022    - set target A1C    Why is this important?   Your target A1C is decided together by you and your doctor.  It is based on several things like your age and other health issues.    Notes:          This is a list of the screening recommended for you and due dates:  Health Maintenance  Topic Date Due   Eye exam for diabetics  Never done   Mammogram  Never done   Hepatitis C Screening  Never done   Zoster (Shingles) Vaccine (1 of 2) 09[birth year]/1962   DEXA scan (bone density measurement)  12/21/2020   DTaP/Tdap/Td  vaccine (2 - Td or Tdap) 01/06/2023   COVID-19 Vaccine (6 - 2023-24 season) 07/06/2023   Hemoglobin A1C  01/27/2024   Yearly kidney health urinalysis for diabetes  07/27/2024   Complete foot exam   07/27/2024   Yearly kidney function blood test for diabetes  07/29/2024   Medicare Annual Wellness Visit  08/24/2024   Pneumonia Vaccine  Completed   Flu Shot  Completed   HPV Vaccine  Aged Out

## 2023-08-25 NOTE — Progress Notes (Signed)
Subjective:   Katherine Richmond is a 81 y.o. female who presents for Medicare Annual (Subsequent) preventive examination.  Visit Complete: In person  Patient Medicare AWV questionnaire was completed by the patient on 08/25/23; I have confirmed that all information answered by patient is correct and no changes since this date.   Review of Systems  Constitutional:  Negative for fever and malaise/fatigue.  HENT:  Negative for congestion, ear pain and sore throat.   Respiratory:  Negative for cough and shortness of breath.   Cardiovascular:  Negative for chest pain and leg swelling.  Gastrointestinal:  Negative for abdominal pain.  Genitourinary:  Negative for dysuria.  Musculoskeletal:  Positive for back pain (Mid back/left shoulder pain). Negative for myalgias.  Neurological:  Negative for dizziness, weakness and headaches.  Psychiatric/Behavioral:  Negative for depression. The patient is not nervous/anxious.     Back pain- continues to have back pain. Gabapentin does not help. Prednisone did not help. Exercises- did not help. Takes tylenol daily. Points to left scapula  Bought pain patches but cannot put them on.   Cardiac Risk Factors include: advanced age (>8men, >78 women);diabetes mellitus     Objective:    Today's Vitals   08/25/23 0915  BP: 138/78  Pulse: (!) 58  Temp: (!) 97 F (36.1 C)  TempSrc: Temporal  SpO2: 98%  Weight: 166 lb 12.8 oz (75.7 kg)  Height: 5\' 8"  (1.727 m)  PainSc: 7    Body mass index is 25.36 kg/m.     08/25/2023    9:35 AM 04/14/2023   11:56 AM 09/30/2022   10:55 AM 04/02/2022    9:36 AM 10/01/2021   10:24 AM 03/26/2021   10:06 AM 09/26/2020   10:20 AM  Advanced Directives  Does Patient Have a Medical Advance Directive? Yes No No No No No No  Type of Estate agent of [place];Living will        Would patient like information on creating a medical advance directive?       No - Patient declined    Current  Medications (verified) Outpatient Encounter Medications as of 08/25/2023  Medication Sig   aspirin 81 MG EC tablet Take 81 mg by mouth daily.   atorvastatin (LIPITOR) 40 MG tablet Take 1 tablet (40 mg total) by mouth daily.   cetirizine (ZYRTEC ALLERGY) 10 MG tablet Take 1 tablet (10 mg total) by mouth at bedtime.   ferrous sulfate 325 (65 FE) MG tablet Take by mouth.   fluticasone (FLONASE) 50 MCG/ACT nasal spray Place 2 sprays into both nostrils daily.   irbesartan-hydrochlorothiazide (AVALIDE) 300-12.5 MG tablet Take 1 tablet by mouth daily.   levothyroxine (SYNTHROID) 50 MCG tablet TAKE ONE TABLET BY MOUTH DAILY   metFORMIN (GLUCOPHAGE) 1000 MG tablet Take 1 tablet by mouth daily.   metoprolol succinate (TOPROL-XL) 100 MG 24 hr tablet Take 1 tablet (100 mg total) by mouth daily.   Multiple Vitamins-Minerals (MULTIPLE VITAMINS/WOMENS PO) Take by mouth.   omega-3 acid ethyl esters (LOVAZA) 1 g capsule Take 2 capsules (2 g total) by mouth 2 (two) times daily.   traZODone (DESYREL) 50 MG tablet Take 1 tablet (50 mg total) by mouth at bedtime.   gabapentin (NEURONTIN) 300 MG capsule Take 1 capsule (300 mg total) by mouth 2 (two) times daily.   No facility-administered encounter medications on file as of 08/25/2023.    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   TONSILLECTOMY  2002   Family History  Problem Relation Age of Onset   Breast cancer Mother    Colon cancer Mother    Renal cancer Mother    Liver cancer Half-Brother    Multiple sclerosis Half-Brother    Hypertension Other    Transient ischemic attack Other    Glaucoma Other    Social History   Socioeconomic History   Marital status: Married    Spouse name: Leisure centre manager Djordjevic   Number of children: 1   Years of education: Not on file   Highest education level: Not on file  Occupational History   Occupation: RETIRED   Occupation: [employer] - [employer], [employer], FRI  Tobacco Use   Smoking status: Former    Current packs/day: 0.00    Types: Cigarettes    Quit date: 2012    Years since quitting: 12.8   Smokeless tobacco: Never  Substance and Sexual Activity   Alcohol use: Yes    Alcohol/week: 1.0 standard drink of alcohol    Types: 1 Glasses of wine per week    Comment: SOCIAL USE ONLY   Drug use: Never   Sexual activity: Not on file  Other Topics Concern   Not on file  Social History Narrative   Not on file   Social Determinants of Health   Financial Resource Strain: Low Risk  (08/25/2023)   Overall Financial Resource Strain (CARDIA)    Difficulty of Paying Living Expenses: Not hard at all  Food Insecurity: No Food Insecurity (08/25/2023)   Hunger Vital Sign    Worried About Running Out of Food in the Last Year: Never true    Ran Out of Food in the Last Year: Never true  Transportation Needs: No Transportation Needs (08/25/2023)   PRAPARE - Administrator, Civil Service (Medical): No    Lack of Transportation (Non-Medical): No  Physical Activity: Insufficiently Active (08/25/2023)   Exercise Vital Sign    Days of Exercise per Week: 2 days    Minutes of Exercise per Session: 30 min  Stress: Stress Concern Present (08/25/2023)   [employer] of Occupational Health - Occupational Stress Questionnaire    Feeling of Stress : To some extent  Social Connections: Moderately Integrated (08/25/2023)   Social Connection and Isolation Panel [NHANES]    Frequency of Communication with Friends and Family: More than three times a week    Frequency of Social Gatherings with Friends and Family: More than three times a week    Attends Religious Services: 1 to 4 times per year    Active Member of [employer] or Organizations: No    Attends Banker Meetings: Never    Marital Status: Married    Tobacco Counseling Counseling  given: Not Answered   Clinical Intake:     Pain : 0-10 Pain Score: 7  Pain Type: Acute pain Pain Location: Neck Pain Descriptors / Indicators: Aching Pain Onset: 1 to 4 weeks ago Pain Frequency: Intermittent     BMI - recorded: 25.37 Nutritional Status: BMI 25 -29 Overweight  How often do you need to have someone help you when you read instructions, pamphlets, or other written materials from your doctor or pharmacy?: 1 - Never         Activities of Daily Living    08/25/2023    9:31 AM 01/27/2023   10:19 AM  In your present state of health, do you have any difficulty performing the following activities:  Hearing? 0 0  Vision? 0 0  Difficulty concentrating or making decisions? 0 0  Walking or climbing stairs? 0 0  Dressing or bathing? 0 0  Doing errands, shopping? 0 0  Preparing Food and eating ? N   Using the Toilet? N   In the past six months, have you accidently leaked urine? N   Do you have problems with loss of bowel control? N   Managing your Medications? N   Managing your Finances? N   Housekeeping or managing your Housekeeping? N     Patient Care Team: Blane Ohara, MD as PCP - General (Family Medicine) Deveron Furlong, MD as Surgical Oncologist (Oncology) Weston Settle, MD as Consulting Physician (Oncology) Earvin Hansen, [facility] (Inactive) as Pharmacist  (Pharmacist) Marylen Ponto, DO (Obstetrics and Gynecology)  Indicate any recent Medical Services you may have received from other than [facility] providers in the past year (date may be approximate).     Assessment:   This is a routine wellness examination for Ash.  Hearing/Vision screen No results found.   Goals Addressed   None   Depression Screen    08/25/2023    9:28 AM 07/28/2023    8:44 AM 01/27/2023   10:19 AM 06/19/2021    4:02 PM 02/18/2021    4:29 PM 12/21/2019    4:12 PM  PHQ 2/9 Scores  PHQ - 2 Score 0 1 0 0 0 0  PHQ- 9 Score 0 4        Fall Risk    08/25/2023    9:28 AM 07/28/2023    8:43 AM 01/27/2023   10:19 AM 06/19/2021    4:02 PM 06/26/2020    9:21 AM  Fall Risk   Falls in the past year? 0 0 0 0 0  Number falls in past yr: 0 0 0 0 0  Injury with Fall? 0 0 0 0   Risk for fall due to : No Fall Risks No Fall Risks No Fall Risks No Fall Risks   Follow up Falls evaluation completed Falls evaluation completed;Falls prevention discussed Falls evaluation completed Falls evaluation completed     MEDICARE RISK AT HOME: Medicare Risk at Home Any stairs in or around the home?: No If so, are there any without handrails?: No Home free of loose throw rugs in walkways, pet beds, electrical cords, etc?: Yes Adequate lighting in your home to reduce risk of falls?: Yes Life alert?: No Use of a cane, walker or w/c?: No Grab bars in the bathroom?: Yes Shower chair or bench in shower?: Yes Elevated toilet seat or a handicapped toilet?: Yes  TIMED UP AND GO:  Was the test performed?  No    Cognitive Function:        08/25/2023  9:21 AM 06/19/2021    4:03 PM 12/21/2019    4:13 PM  6CIT Screen  What Year? 4 points 0 points 0 points  What month? 3 points 0 points 0 points  What time? 3 points 0 points 0 points  Count back from 20 4 points 0 points 0 points  Months in reverse 4 points 0 points 0 points  Repeat phrase 10 points 0 points 0 points  Total Score 28  points 0 points 0 points    Immunizations Immunization History  Administered Date(s) Administered   Fluad Quad(high Dose 65+) 08/03/2020, 08/06/2021, 08/20/2022   Fluad Trivalent(High Dose 65+) 07/28/2023   Influenza-Unspecified 06/16/2019   Moderna SARS-COV2 Booster Vaccination 06/19/2021   Moderna Sars-Covid-2 Vaccination 12/31/2019, 01/31/2020, 08/31/2020   Pfizer Covid-19 Vaccine Bivalent Booster 59yrs & up 12/10/2021   Pfizer(Comirnaty)Fall Seasonal Vaccine 12 years and older 08/20/2022   Pneumococcal Conjugate-13 08/03/2015   Pneumococcal Polysaccharide-23 11/08/2013   Tdap 01/05/2013   Zoster, Live 09/10/2016     Screening Tests Health Maintenance  Topic Date Due   OPHTHALMOLOGY EXAM  Never done   MAMMOGRAM  Never done   Hepatitis C Screening  Never done   Zoster Vaccines- Shingrix (1 of 2) 09/11/1962   DEXA SCAN  12/21/2020   DTaP/Tdap/Td (2 - Td or Tdap) 01/06/2023   COVID-19 Vaccine (6 - 2023-24 season) 07/06/2023   HEMOGLOBIN A1C  01/27/2024   Diabetic kidney evaluation - Urine ACR  07/27/2024   FOOT EXAM  07/27/2024   Diabetic kidney evaluation - eGFR measurement  07/29/2024   Medicare Annual Wellness (AWV)  08/24/2024   Pneumonia Vaccine 48+ Years old  Completed   INFLUENZA VACCINE  Completed   HPV VACCINES  Aged Out    Health Maintenance  Health Maintenance Due  Topic Date Due   OPHTHALMOLOGY EXAM  Never done   MAMMOGRAM  Never done   Hepatitis C Screening  Never done   Zoster Vaccines- Shingrix (1 of 2) 09/11/1962   DEXA SCAN  12/21/2020   DTaP/Tdap/Td (2 - Td or Tdap) 01/06/2023   COVID-19 Vaccine (6 - 2023-24 season) 07/06/2023        Additional Screening:    Vision Screening: Recommended annual ophthalmology exams for early detection of glaucoma and other disorders of the eye. Is the patient up to date with their annual eye exam?  Yes     Dental Screening: Recommended annual dental exams for proper oral hygiene         Plan:      I have personally reviewed and noted the following in the patient's chart:   Medical and social history Use of alcohol, tobacco or illicit drugs  Current medications and supplements including opioid prescriptions. Patient is not currently taking opioid prescriptions. Functional ability and status Nutritional status Physical activity Advanced directives List of other physicians Hospitalizations, surgeries, and ER visits in previous 12 months Vitals Screenings to include cognitive, depression, and falls Referrals and appointments  In addition, I have reviewed and discussed with patient certain preventive protocols, quality metrics, and best practice recommendations. A written personalized care plan for preventive services as well as general preventive health recommendations were provided to patient.       I,Lauren M Auman,acting as a Neurosurgeon for [employer], MD.,have documented all relevant documentation on the behalf of Windell Moment, MD,as directed by  Windell Moment, MD while in the presence of Windell Moment, MD.   Precious Reel, CMA   08/25/2023

## 2023-09-02 DIAGNOSIS — C7889 Secondary malignant neoplasm of other digestive organs: Secondary | ICD-10-CM | POA: Diagnosis not present

## 2023-09-02 DIAGNOSIS — Z9889 Other specified postprocedural states: Secondary | ICD-10-CM | POA: Diagnosis not present

## 2023-09-02 DIAGNOSIS — C787 Secondary malignant neoplasm of liver and intrahepatic bile duct: Secondary | ICD-10-CM | POA: Diagnosis not present

## 2023-09-02 DIAGNOSIS — R918 Other nonspecific abnormal finding of lung field: Secondary | ICD-10-CM | POA: Diagnosis not present

## 2023-09-02 DIAGNOSIS — K828 Other specified diseases of gallbladder: Secondary | ICD-10-CM | POA: Diagnosis not present

## 2023-09-02 DIAGNOSIS — C7801 Secondary malignant neoplasm of right lung: Secondary | ICD-10-CM | POA: Diagnosis not present

## 2023-09-02 DIAGNOSIS — Z8589 Personal history of malignant neoplasm of other organs and systems: Secondary | ICD-10-CM | POA: Diagnosis not present

## 2023-09-04 ENCOUNTER — Ambulatory Visit: Payer: Medicare PPO

## 2023-09-04 VITALS — BP 134/62 | HR 60 | Temp 97.6°F | Resp 16 | Ht 68.0 in | Wt 161.0 lb

## 2023-09-04 DIAGNOSIS — M5442 Lumbago with sciatica, left side: Secondary | ICD-10-CM

## 2023-09-04 MED ORDER — METHYLPREDNISOLONE 4 MG PO TBPK: ORAL_TABLET | ORAL | 1 refills | Status: DC

## 2023-09-04 MED ORDER — METHOCARBAMOL 750 MG PO TABS: 750.0000 mg | ORAL_TABLET | Freq: Three times a day (TID) | ORAL | 0 refills | Status: AC | PRN

## 2023-09-04 NOTE — Patient Instructions (Signed)
Appears to be SCIATICA on your left side from a muscle strain Sent MEDROL DOSEPAK and a muscle relaxant to your pharmacy Continue ice or heat Continue gentle stretching Report back if any worsening symptoms or call 911 for any severe symptoms

## 2023-09-04 NOTE — Assessment & Plan Note (Signed)
Acute onset low back pain with left sciatica for a week. Possibly piriformis syndrome from muscle strain. 10/10 discomfort today with positive tenderness and positive SLR on left.  PLAN: 1. Sent prescription for METHOCARBAMOL, to take up to 2 or 3 times daily as needed for muscle spasms/ stiffness. Cautioned that the medicine could cause some drowsiness  2. Also sent MEDROL DOSEPAK to her pharmacy as she reports significant discomfort.  3. Advised to continue ice or heat, gentle stretching etc  4. Report back if any worsening symptoms or call 911 for any severe symptoms

## 2023-09-04 NOTE — Progress Notes (Signed)
Subjective:  Patient ID: Katherine Richmond, female    DOB: Jan 15, 1941  Age: 82 y.o. MRN: [id number]  Chief Complaint  Patient presents with   Back Pain   Rectal Pain    Back Pain  Pushed her husband's wheel chair last week, few times up a ramp and an incline. The left buttock pain going down into her leg for a week now States she has been having trouble sleeping Was at work and her supervisor told her to go see a doctor.  Has taken tylenol, rubbing creams etc with no relief. Reports stiffness in the morning. Hurts with activity too Reports the pain around 10/10 easily         08/25/2023    9:28 AM 07/28/2023    8:44 AM 01/27/2023   10:19 AM 06/19/2021    4:02 PM 02/18/2021    4:29 PM  Depression screen PHQ 2/9  Decreased Interest 0 1 0 0 0  Down, Depressed, Hopeless 0 0 0 0 0  PHQ - 2 Score 0 1 0 0 0  Altered sleeping 0 2     Tired, decreased energy 0 1     Change in appetite 0 0     Feeling bad or failure about yourself  0 0     Trouble concentrating 0 0     Moving slowly or fidgety/restless 0 0     Suicidal thoughts 0 0     PHQ-9 Score 0 4     Difficult doing work/chores Not difficult at all Not difficult at all           08/25/2023    9:28 AM  Fall Risk   Falls in the past year? 0  Number falls in past yr: 0  Injury with Fall? 0  Risk for fall due to : No Fall Risks  Follow up Falls evaluation completed    Patient Care Team: Blane Ohara, MD as PCP - General (Family Medicine) Deveron Furlong, MD as Surgical Oncologist (Oncology) Weston Settle, MD as Consulting Physician (Oncology) Earvin Hansen, [facility] (Inactive) as Pharmacist (Pharmacist) Marylen Ponto, DO (Obstetrics and Gynecology)   Review of Systems  Musculoskeletal:  Positive for back pain and myalgias.  All other systems reviewed and are negative.   Current Outpatient Medications on File Prior to Visit  Medication Sig Dispense Refill   aspirin 81 MG EC tablet Take 81 mg by mouth daily.      atorvastatin (LIPITOR) 40 MG tablet Take 1 tablet (40 mg total) by mouth daily. 90 tablet 1   cetirizine (ZYRTEC ALLERGY) 10 MG tablet Take 1 tablet (10 mg total) by mouth at bedtime. 90 tablet 3   ferrous sulfate 325 (65 FE) MG tablet Take by mouth.     fluticasone (FLONASE) 50 MCG/ACT nasal spray Place 2 sprays into both nostrils daily. 48 g 1   irbesartan-hydrochlorothiazide (AVALIDE) 300-12.5 MG tablet Take 1 tablet by mouth daily. 90 tablet 0   levothyroxine (SYNTHROID) 50 MCG tablet TAKE ONE TABLET BY MOUTH DAILY 90 tablet 3   metFORMIN (GLUCOPHAGE) 1000 MG tablet Take 1 tablet by mouth daily. 90 tablet 1   metoprolol succinate (TOPROL-XL) 100 MG 24 hr tablet Take 1 tablet (100 mg total) by mouth daily. 90 tablet 1   Multiple Vitamins-Minerals (MULTIPLE VITAMINS/WOMENS PO) Take by mouth.     omega-3 acid ethyl esters (LOVAZA) 1 g capsule Take 2 capsules (2 g total) by mouth 2 (two) times daily. 360 capsule 0  traZODone (DESYREL) 50 MG tablet Take 1 tablet (50 mg total) by mouth at bedtime. 90 tablet 1   gabapentin (NEURONTIN) 300 MG capsule Take 1 capsule (300 mg total) by mouth 2 (two) times daily. 180 capsule 1   No current facility-administered medications on file prior to visit.   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   TONSILLECTOMY  2002     Family History  Problem Relation Age of Onset   Breast cancer Mother    Colon cancer Mother    Renal cancer Mother    Liver cancer Half-Brother    Multiple sclerosis Half-Brother    Hypertension Other    Transient ischemic attack Other    Glaucoma Other    Social History   Socioeconomic History   Marital status: Married    Spouse name: Leisure centre manager [place]   Number of children: 1   Years of education: Not on file   Highest education level: Not on file  Occupational History   Occupation: RETIRED   Occupation: [employer] - [employer], [employer], FRI  Tobacco Use   Smoking status: Former    Current packs/day: 0.00    Types: Cigarettes    Quit date: 2012    Years since quitting: 12.8   Smokeless tobacco: Never  Substance and Sexual Activity   Alcohol use: Yes    Alcohol/week: 1.0 standard drink of alcohol    Types: 1 Glasses of wine per week    Comment: SOCIAL USE ONLY   Drug use: Never   Sexual activity: Not on file  Other Topics Concern   Not on file  Social History Narrative   Not on file   Social Determinants of Health   Financial Resource Strain: Low Risk  (08/25/2023)   Overall Financial Resource Strain (CARDIA)    Difficulty of Paying Living Expenses: Not hard at all  Food Insecurity: No Food Insecurity (08/25/2023)   Hunger Vital Sign    Worried About Running Out of Food in the Last Year: Never true    Ran Out of Food in the Last Year: Never true  Transportation Needs: No Transportation Needs (08/25/2023)   PRAPARE - Administrator, Civil Service (Medical): No    Lack of Transportation (Non-Medical): No  Physical Activity: Insufficiently Active (08/25/2023)   Exercise Vital Sign    Days of Exercise per Week: 2 days    Minutes of Exercise per Session: 30 min  Stress: Stress Concern Present (08/25/2023)   [employer] of Occupational Health - Occupational Stress Questionnaire    Feeling of Stress : To some extent  Social Connections: Moderately Integrated  (08/25/2023)   Social Connection and Isolation Panel [NHANES]    Frequency of Communication with Friends and Family: More than three times a week    Frequency of Social  Gatherings with Friends and Family: More than three times a week    Attends Religious Services: 1 to 4 times per year    Active Member of [employer] or Organizations: No    Attends Engineer, structural: Never    Marital Status: Married    Objective:  BP 134/62 (BP Location: Left Arm, Patient Position: Sitting, Cuff Size: Large)   Pulse 60   Temp 97.6 F (36.4 C)   Resp 16   Ht 5\' 8"  (1.727 m)   Wt 161 lb (73 kg)   SpO2 99%   BMI 24.48 kg/m      09/04/2023   11:42 AM 08/25/2023    9:15 AM 07/30/2023   10:05 AM  BP/Weight  Systolic BP 134 138 182  Diastolic BP 62 78 80  Wt. (Lbs) 161 166.8   BMI 24.48 kg/m2 25.36 kg/m2     Physical Exam Vitals and nursing note reviewed.  Constitutional:      General: She is in acute distress (in pain).  HENT:     Head: Normocephalic and atraumatic.  Cardiovascular:     Rate and Rhythm: Normal rate and regular rhythm.  Pulmonary:     Effort: Pulmonary effort is normal.     Breath sounds: Normal breath sounds.  Musculoskeletal:        General: Tenderness (lower lumbar spine, left buttock tenderness and POSITIVE SLR ON LEFT) present. No swelling.  Neurological:     General: No focal deficit present.     Mental Status: She is alert.  Psychiatric:        Mood and Affect: Mood normal.     Diabetic Foot Exam - Simple   No data filed      Lab Results  Component Value Date   WBC 4.0 07/30/2023   HGB 11.0 (L) 07/30/2023   HCT 34.1 07/30/2023   PLT 171 07/30/2023   GLUCOSE 110 (H) 07/30/2023   CHOL 169 07/30/2023   TRIG 110 07/30/2023   HDL 51 07/30/2023   LDLCALC 98 07/30/2023   ALT 17 07/30/2023   AST 33 07/30/2023   NA 140 07/30/2023   K 3.9 07/30/2023   CL 103 07/30/2023   CREATININE 1.21 (H) 07/30/2023   BUN 12 07/30/2023   CO2 25 07/30/2023    TSH 1.530 02/03/2023   HGBA1C 5.7 (H) 07/30/2023   MICROALBUR 10 08/06/2021      Assessment & Plan:    Acute left-sided low back pain with left-sided sciatica Assessment & Plan: Acute onset low back pain with left sciatica for a week. Possibly piriformis syndrome from muscle strain. 10/10 discomfort today with positive tenderness and positive SLR on left.  PLAN: 1. Sent prescription for METHOCARBAMOL, to take up to 2 or 3 times daily as needed for muscle spasms/ stiffness. Cautioned that the medicine could cause some drowsiness  2. Also sent MEDROL DOSEPAK to her pharmacy as she reports significant discomfort.  3. Advised to continue ice or heat, gentle stretching etc  4. Report back if any worsening symptoms or call 911 for any severe symptoms     Other orders -     Methocarbamol; Take 1 tablet (750 mg total) by mouth every 8 (eight) hours as needed for up to 7 days for muscle spasms.  Dispense: 21 tablet; Refill: 0 -     methylPREDNISolone; 6 tabs on day 1, 5 tabs on day 2, 4 tabs on day 3, 3 tabs on day 4, 2 tabs on day 5,  1 tab on day 6.  Dispense: 21 tablet; Refill: 1     Meds ordered this encounter  Medications   methocarbamol (ROBAXIN) 750 MG tablet    Sig: Take 1 tablet (750 mg total) by mouth every 8 (eight) hours as needed for up to 7 days for muscle spasms.    Dispense:  21 tablet    Refill:  0   methylPREDNISolone (MEDROL DOSEPAK) 4 MG TBPK tablet    Sig: 6 tabs on day 1, 5 tabs on day 2, 4 tabs on day 3, 3 tabs on day 4, 2 tabs on day 5,  1 tab on day 6.    Dispense:  21 tablet    Refill:  1    No orders of the defined types were placed in this encounter.    Follow-up: Return if symptoms worsen or fail to improve.   I,Angela Taylor,acting as a Neurosurgeon for [employer], MD.,have documented all relevant documentation on the behalf of Windell Moment, MD,as directed by  Windell Moment, MD while in the presence of Windell Moment, MD.   An After Visit  Summary was printed and given to the patient.  Windell Moment, MD [facility] [phone]

## 2023-09-07 ENCOUNTER — Other Ambulatory Visit: Payer: Self-pay | Admitting: Family Medicine

## 2023-09-09 DIAGNOSIS — C787 Secondary malignant neoplasm of liver and intrahepatic bile duct: Secondary | ICD-10-CM | POA: Diagnosis not present

## 2023-09-09 DIAGNOSIS — C480 Malignant neoplasm of retroperitoneum: Secondary | ICD-10-CM | POA: Diagnosis not present

## 2023-09-11 ENCOUNTER — Other Ambulatory Visit: Payer: Self-pay | Admitting: Family Medicine

## 2023-09-23 DIAGNOSIS — C787 Secondary malignant neoplasm of liver and intrahepatic bile duct: Secondary | ICD-10-CM | POA: Diagnosis not present

## 2023-10-20 ENCOUNTER — Ambulatory Visit
Admission: RE | Admit: 2023-10-20 | Discharge: 2023-10-20 | Disposition: A | Discharge status: Home / Self Care | Payer: Medicare PPO | Source: Ambulatory Visit

## 2023-10-20 DIAGNOSIS — Z1231 Encounter for screening mammogram for malignant neoplasm of breast: Secondary | ICD-10-CM

## 2023-11-09 ENCOUNTER — Other Ambulatory Visit: Payer: Self-pay | Admitting: Family Medicine

## 2023-11-18 DIAGNOSIS — C7889 Secondary malignant neoplasm of other digestive organs: Secondary | ICD-10-CM | POA: Diagnosis not present

## 2023-11-18 DIAGNOSIS — C787 Secondary malignant neoplasm of liver and intrahepatic bile duct: Secondary | ICD-10-CM | POA: Diagnosis not present

## 2023-11-18 DIAGNOSIS — Z87891 Personal history of nicotine dependence: Secondary | ICD-10-CM | POA: Diagnosis not present

## 2023-11-18 DIAGNOSIS — Z7982 Long term (current) use of aspirin: Secondary | ICD-10-CM | POA: Diagnosis not present

## 2023-11-18 DIAGNOSIS — R918 Other nonspecific abnormal finding of lung field: Secondary | ICD-10-CM | POA: Diagnosis not present

## 2023-11-18 DIAGNOSIS — K828 Other specified diseases of gallbladder: Secondary | ICD-10-CM | POA: Diagnosis not present

## 2023-11-18 DIAGNOSIS — Z7984 Long term (current) use of oral hypoglycemic drugs: Secondary | ICD-10-CM | POA: Diagnosis not present

## 2023-11-18 DIAGNOSIS — C480 Malignant neoplasm of retroperitoneum: Secondary | ICD-10-CM | POA: Diagnosis not present

## 2023-11-18 DIAGNOSIS — R935 Abnormal findings on diagnostic imaging of other abdominal regions, including retroperitoneum: Secondary | ICD-10-CM | POA: Diagnosis not present

## 2023-11-18 DIAGNOSIS — Z79899 Other long term (current) drug therapy: Secondary | ICD-10-CM | POA: Diagnosis not present

## 2023-11-30 ENCOUNTER — Other Ambulatory Visit: Payer: Self-pay | Admitting: Family Medicine

## 2023-12-01 ENCOUNTER — Ambulatory Visit: Payer: Medicare PPO | Admitting: Family Medicine

## 2023-12-14 NOTE — Progress Notes (Signed)
 Subjective:  Patient ID: Katherine Richmond, female    DOB: 12/27/1940  Age: 83 y.o. MRN: [id number]  Chief Complaint  Patient presents with   Medical Management of Chronic Issues    HPI Diabetes:  Complications: Hyperlipidemia, Hypertension. Glucose checking: not checking. Most recent A1C: 6.0% Current medications: Metformin 1000 mg daily. Last Eye Exam: [facility] in March 2025.  Foot checks: daily   Hyperlipidemia: Current medications: Atorvastatin 40 mg daily, Omega-3 1000 mg twice a day. Eating healthy. exercising   Hypertension: Complications: Diabetes, Hypertension. Current medications: Irbesartan-Hctz 300-12.5 mg daily, Metoprolol 100 mg daily, Aspirin 81 mg daily.   Hypothyroidism: She is taking Levothyroxine 50 mcg daily.   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 Has been on chemo since October 2023. Patient is on a break from chemo. Lost 25 lbs.  Has a lot of side effects. 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 She reports that her oncologist has recommended a break from chemotherapy due to the stable appearance of the metastatic lesions and the toll the treatment has been taking on her body. She denies any pain and reports a stable weight around 159-161 lbs. She also reports that she is still working, which she finds enjoyable and beneficial for her mental acuity.  In addition to her cancer, the patient has a history of diabetes, managed with metformin, and hypertension, managed with irbesartan, hydrochlorothiazide, and metoprolol. She also takes atorvastatin for high cholesterol, trazodone for sleep, and thyroid medication. She reports no issues with her current medication regimen.  The patient also reports a recent injury to her left foot, which has resulted in difficulty lifting the foot. She denies any pain or numbness associated with this issue.   She also reports a  recent hospitalization of her spouse, who was treated for a UTI and a GI bleed. She is his main caretaker and he is currently in rehab at a local nursing home.      08/25/2023    9:28 AM 07/28/2023    8:44 AM 01/27/2023   10:19 AM 06/19/2021    4:02 PM 02/18/2021    4:29 PM  Depression screen PHQ 2/9  Decreased Interest 0 1 0 0 0  Down, Depressed, Hopeless 0 0 0 0 0  PHQ - 2 Score 0 1 0 0 0  Altered sleeping 0 2     Tired, decreased energy 0 1     Change in appetite 0 0     Feeling bad or failure about yourself  0 0     Trouble concentrating 0 0     Moving slowly or fidgety/restless 0 0     Suicidal thoughts 0 0     PHQ-9 Score 0 4     Difficult doing work/chores Not difficult at all Not difficult at all           08/25/2023    9:28 AM  Fall Risk   Falls in the past year? 0  Number falls in past yr: 0  Injury with Fall? 0  Risk for fall due to : No Fall Risks  Follow up Falls evaluation completed    Patient Care Team: Blane Ohara, MD as PCP - General (Family Medicine) Deveron Furlong, MD as Surgical Oncologist (Oncology) Weston Settle, MD as Consulting Physician (Oncology) Earvin Hansen, [facility] (Inactive) as Pharmacist (Pharmacist) Marylen Ponto, DO (Obstetrics and Gynecology)   Review of  Systems  Constitutional:  Positive for fatigue. Negative for chills and fever.  HENT:  Negative for congestion, ear pain, rhinorrhea and sore throat.   Respiratory:  Negative for cough and shortness of breath.   Cardiovascular:  Negative for chest pain.  Gastrointestinal:  Negative for abdominal pain, constipation, diarrhea, nausea and vomiting.  Genitourinary:  Negative for dysuria and urgency.  Musculoskeletal:  Negative for back pain and myalgias.  Neurological:  Negative for dizziness, weakness, light-headedness and headaches.  Psychiatric/Behavioral:  Negative for dysphoric mood. The patient is not nervous/anxious.     Current Outpatient Medications on File Prior to Visit   Medication Sig Dispense Refill   aspirin 81 MG EC tablet Take 81 mg by mouth daily.     atorvastatin (LIPITOR) 40 MG tablet Take 1 tablet (40 mg total) by mouth daily. 90 tablet 1   cetirizine (ZYRTEC ALLERGY) 10 MG tablet Take 1 tablet (10 mg total) by mouth at bedtime. 90 tablet 3   ferrous sulfate 325 (65 FE) MG tablet Take by mouth.     fluticasone (FLONASE) 50 MCG/ACT nasal spray Place 2 sprays into both nostrils daily. 48 g 1   irbesartan-hydrochlorothiazide (AVALIDE) 300-12.5 MG tablet Take 1 tablet by mouth daily. 90 tablet 0   levothyroxine (SYNTHROID) 50 MCG tablet TAKE ONE TABLET BY MOUTH DAILY 90 tablet 3   metFORMIN (GLUCOPHAGE) 1000 MG tablet Take 1 tablet by mouth daily. 90 tablet 1   methylPREDNISolone (MEDROL DOSEPAK) 4 MG TBPK tablet 6 tabs on day 1, 5 tabs on day 2, 4 tabs on day 3, 3 tabs on day 4, 2 tabs on day 5,  1 tab on day 6. 21 tablet 1   metoprolol succinate (TOPROL-XL) 100 MG 24 hr tablet Take 1 tablet (100 mg total) by mouth daily. 90 tablet 1   Multiple Vitamins-Minerals (MULTIPLE VITAMINS/WOMENS PO) Take by mouth.     omega-3 acid ethyl esters (LOVAZA) 1 g capsule Take 2 capsules (2 g total) by mouth 2 (two) times daily. 360 capsule 0   traZODone (DESYREL) 50 MG tablet Take 1 tablet (50 mg total) by mouth at bedtime. 90 tablet 1   No current facility-administered medications on file prior to visit.   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   TONSILLECTOMY  2002    Family History  Problem Relation Age of Onset   Breast cancer Mother    Colon cancer Mother    Renal cancer Mother    Liver cancer Half-Brother    Multiple sclerosis Half-Brother    Hypertension Other    Transient ischemic attack Other    Glaucoma Other    Social History   Socioeconomic History   Marital status: Married    Spouse name: Greggory Stallion Mccready   Number of children: 1   Years  of education: Not on file   Highest education level: Not on file  Occupational History   Occupation: RETIRED   Occupation: [employer] - W[employer], THUR, FRI  Tobacco Use   Smoking status: Former    Current packs/day: 0.00    Types: Cigarettes    Quit date: 2012    Years since quitting: 13.1   Smokeless tobacco: Never  Substance and Sexual Activity   Alcohol use: Yes    Alcohol/week: 1.0 standard drink of alcohol    Types: 1 Glasses of wine per week    Comment: SOCIAL USE ONLY   Drug use: Never   Sexual activity: Not on file  Other Topics Concern   Not on file  Social History Narrative   Not on file   Social Drivers of Health   Financial Resource Strain: Low Risk  (08/25/2023)   Overall Financial Resource Strain (CARDIA)    Difficulty of Paying Living Expenses: Not hard at all  Food Insecurity: No Food Insecurity (08/25/2023)   Hunger Vital Sign    Worried About Running Out of Food in the Last Year: Never true    Ran Out of Food in the Last Year: Never true  Transportation Needs: No Transportation Needs (08/25/2023)   PRAPARE - Administrator, Civil Service (Medical): No    Lack of Transportation (Non-Medical): No  Physical Activity: Insufficiently Active (08/25/2023)   Exercise Vital Sign    Days of Exercise per Week: 2 days    Minutes of Exercise per Session: 30 min  Stress: Stress Concern Present (08/25/2023)   [employer] of  Occupational Health - Occupational Stress Questionnaire    Feeling of Stress : To some extent  Social Connections: Moderately Integrated (08/25/2023)   Social Connection and Isolation Panel [NHANES]    Frequency of Communication with Friends and Family: More than three times a week    Frequency of Social Gatherings with Friends and Family: More than three times a week    Attends Religious Services: 1 to 4 times per year    Active Member of [employer] or Organizations: No    Attends Engineer, structural: Never    Marital Status: Married    Objective:  BP 138/80   Pulse 68   Temp 97.8 F (36.6 C)   Ht 5\' 8"  (1.727 m)   Wt 161 lb (73 kg)   SpO2 90%   BMI 24.48 kg/m      12/15/2023    9:22 AM 09/04/2023   11:42 AM 08/25/2023    9:15 AM  BP/Weight  Systolic BP 138 134 138  Diastolic BP 80 62 78  Wt. (Lbs) 161 161 166.8  BMI 24.48 kg/m2 24.48 kg/m2 25.36 kg/m2    Physical Exam Vitals reviewed.  Constitutional:      Appearance: Normal appearance. She is normal weight.  Neck:     Vascular: No carotid bruit.  Cardiovascular:     Rate and Rhythm: Normal rate and regular rhythm.     Heart sounds: Normal heart sounds.  Pulmonary:     Effort: Pulmonary effort is normal. No respiratory distress.     Breath sounds: Normal breath sounds.  Abdominal:     General: Abdomen is flat. Bowel sounds are normal.     Palpations: Abdomen is soft.     Tenderness: There is no abdominal tenderness.  Musculoskeletal:     Comments: Thumbs OA findings.  LEFT partial foot drop. Worsens when ankle is at 90 degrees. Affects  gait, but not falling.   Neurological:     Mental Status: She is alert and oriented to person, place, and time.  Psychiatric:        Mood and Affect: Mood normal.        Behavior: Behavior normal.     Diabetic Foot Exam - Simple   Simple Foot Form Diabetic Foot exam was performed with the following findings: Yes 12/15/2023  9:58 AM  Visual Inspection See comments:  Yes Sensation Testing Intact to touch and monofilament testing bilaterally: Yes Pulse Check Posterior Tibialis and Dorsalis pulse intact bilaterally: Yes Comments Darkened, thick nails.       Lab Results  Component Value Date   WBC 4.0 07/30/2023   HGB 11.0 (L) 07/30/2023   HCT 34.1 07/30/2023   PLT 171 07/30/2023   GLUCOSE 110 (H) 07/30/2023   CHOL 191 12/15/2023   TRIG 127 12/15/2023   HDL 68 12/15/2023   LDLCALC 101 (H) 12/15/2023   ALT 17 07/30/2023   AST 33 07/30/2023   NA 140 07/30/2023   K 3.9 07/30/2023   CL 103 07/30/2023   CREATININE 1.21 (H) 07/30/2023   BUN 12 07/30/2023   CO2 25 07/30/2023   TSH 1.530 02/03/2023   HGBA1C 6.2 (H) 12/15/2023   MICROALBUR 10 08/06/2021      Assessment & Plan:    Hypertension complicating diabetes (HCC) Assessment & Plan: Blood pressure slightly elevated at the visit, but within acceptable range for the patient. -Continue current antihypertensive regimen (Irbesartan/Hydrochlorothiazide, Metoprolol).   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 -Continue Metformin. -Check A1C.  Orders: -     Hemoglobin A1c -     Microalbumin / creatinine urine ratio  Mixed hyperlipidemia Assessment & Plan: On Atorvastatin and prescription fish oil (Lovaza). -Continue current regimen.  Orders: -     Lipid panel  Acquired left foot drop Assessment & Plan: New onset foot drop on the left side after an injury. No pain or numbness. -Refer to physical therapy for evaluation and possible brace fitting.  Orders: -     Ambulatory referral to Physical Therapy  Osteoporosis screening -     DG Bone Density; Future  Malignant neoplasm metastatic to liver [facility]) Assessment & Plan: Stable disease with metastasis to liver and lungs. Currently  on a break from chemotherapy due to side effects and stable disease. -Continue current break from chemotherapy as per oncologist's recommendation.      No orders of the defined types were placed in this encounter.   Orders Placed This Encounter  Procedures   DG Bone Density   Hemoglobin A1c   Lipid panel   Microalbumin / creatinine urine ratio   Ambulatory referral to Physical Therapy     Follow-up: Return in about 6 months (around 06/13/2024).   I,Marla I Leal-Borjas,acting as a scribe for Blane Ohara, MD.,have documented all relevant documentation on the behalf of Blane Ohara, MD,as directed by  Blane Ohara, MD while in the presence of Blane Ohara, MD.   An After Visit Summary was printed and given to the patient.  I attest that I have reviewed this visit and agree with the plan scribed by my staff.   Blane Ohara, MD [facility] [phone]

## 2023-12-15 ENCOUNTER — Encounter: Payer: Self-pay | Admitting: Family Medicine

## 2023-12-15 ENCOUNTER — Ambulatory Visit: Payer: Medicare PPO | Admitting: Family Medicine

## 2023-12-15 VITALS — BP 138/80 | HR 68 | Temp 97.8°F | Ht 68.0 in | Wt 161.0 lb

## 2023-12-15 DIAGNOSIS — E1159 Type 2 diabetes mellitus with other circulatory complications: Secondary | ICD-10-CM

## 2023-12-15 DIAGNOSIS — Z13820 Encounter for screening for osteoporosis: Secondary | ICD-10-CM | POA: Diagnosis not present

## 2023-12-15 DIAGNOSIS — E119 Type 2 diabetes mellitus without complications: Secondary | ICD-10-CM

## 2023-12-15 DIAGNOSIS — I10 Essential (primary) hypertension: Secondary | ICD-10-CM

## 2023-12-15 DIAGNOSIS — E782 Mixed hyperlipidemia: Secondary | ICD-10-CM | POA: Diagnosis not present

## 2023-12-15 DIAGNOSIS — C787 Secondary malignant neoplasm of liver and intrahepatic bile duct: Secondary | ICD-10-CM

## 2023-12-15 DIAGNOSIS — E1121 Type 2 diabetes mellitus with diabetic nephropathy: Secondary | ICD-10-CM

## 2023-12-15 DIAGNOSIS — E038 Other specified hypothyroidism: Secondary | ICD-10-CM | POA: Diagnosis not present

## 2023-12-15 DIAGNOSIS — M21372 Foot drop, left foot: Secondary | ICD-10-CM | POA: Diagnosis not present

## 2023-12-15 NOTE — Patient Instructions (Signed)
 Referring to physical therapy at [facility] here in [place] for your foot drop.  Recommend get shingles vaccine series as well as tetanus vaccine (tdap.)

## 2023-12-16 ENCOUNTER — Encounter: Payer: Self-pay | Admitting: Family Medicine

## 2023-12-16 LAB — LIPID PANEL
Chol/HDL Ratio: 2.8 {ratio} (ref 0.0–4.4)
Cholesterol, Total: 191 mg/dL (ref 100–199)
HDL: 68 mg/dL (ref >39)
LDL Chol Calc (NIH): 101 mg/dL — ABNORMAL HIGH (ref 0–99)
Triglycerides: 127 mg/dL (ref 0–149)
VLDL Cholesterol Cal: 22 mg/dL (ref 5–40)

## 2023-12-16 LAB — HEMOGLOBIN A1C
Est. average glucose Bld gHb Est-mCnc: 131 mg/dL
Hgb A1c MFr Bld: 6.2 % — ABNORMAL HIGH (ref 4.8–5.6)

## 2023-12-16 LAB — MICROALBUMIN / CREATININE URINE RATIO
Creatinine, Urine: 66 mg/dL
Microalb/Creat Ratio: 37 mg/g{creat} — ABNORMAL HIGH (ref 0–29)
Microalbumin, Urine: 24.3 ug/mL

## 2023-12-19 NOTE — Assessment & Plan Note (Signed)
Stable disease with metastasis to liver and lungs. Currently on a break from chemotherapy due to side effects and stable disease. -Continue current break from chemotherapy as per oncologist's recommendation.

## 2023-12-19 NOTE — Assessment & Plan Note (Signed)
On Metformin, not checking sugars regularly, but A1C has been acceptable. -Continue Metformin. -Check A1C.

## 2023-12-19 NOTE — Assessment & Plan Note (Signed)
Previously well controlled Continue Synthroid at current dose

## 2023-12-19 NOTE — Assessment & Plan Note (Signed)
Blood pressure slightly elevated at the visit, but within acceptable range for the patient. -Continue current antihypertensive regimen (Irbesartan/Hydrochlorothiazide, Metoprolol).

## 2023-12-19 NOTE — Assessment & Plan Note (Signed)
On Atorvastatin and prescription fish oil (Lovaza). -Continue current regimen.

## 2023-12-19 NOTE — Assessment & Plan Note (Signed)
New onset foot drop on the left side after an injury. No pain or numbness. -Refer to physical therapy for evaluation and possible brace fitting.

## 2023-12-23 ENCOUNTER — Other Ambulatory Visit: Payer: Self-pay | Admitting: Family Medicine

## 2023-12-23 ENCOUNTER — Ambulatory Visit (INDEPENDENT_AMBULATORY_CARE_PROVIDER_SITE_OTHER)
Admission: RE | Admit: 2023-12-23 | Discharge: 2023-12-23 | Disposition: A | Discharge status: Home / Self Care | Payer: Medicare PPO | Source: Ambulatory Visit | Attending: Family Medicine | Admitting: Family Medicine

## 2023-12-23 DIAGNOSIS — Z13820 Encounter for screening for osteoporosis: Secondary | ICD-10-CM

## 2023-12-23 DIAGNOSIS — Z0189 Encounter for other specified special examinations: Secondary | ICD-10-CM | POA: Diagnosis not present

## 2023-12-28 ENCOUNTER — Encounter: Payer: Self-pay | Admitting: Family Medicine

## 2023-12-29 DIAGNOSIS — R918 Other nonspecific abnormal finding of lung field: Secondary | ICD-10-CM | POA: Diagnosis not present

## 2023-12-29 DIAGNOSIS — C480 Malignant neoplasm of retroperitoneum: Secondary | ICD-10-CM | POA: Diagnosis not present

## 2023-12-29 DIAGNOSIS — C787 Secondary malignant neoplasm of liver and intrahepatic bile duct: Secondary | ICD-10-CM | POA: Diagnosis not present

## 2024-01-06 DIAGNOSIS — C787 Secondary malignant neoplasm of liver and intrahepatic bile duct: Secondary | ICD-10-CM | POA: Diagnosis not present

## 2024-01-06 DIAGNOSIS — C480 Malignant neoplasm of retroperitoneum: Secondary | ICD-10-CM | POA: Diagnosis not present

## 2024-01-06 DIAGNOSIS — R918 Other nonspecific abnormal finding of lung field: Secondary | ICD-10-CM | POA: Diagnosis not present

## 2024-01-09 DIAGNOSIS — M21372 Foot drop, left foot: Secondary | ICD-10-CM | POA: Diagnosis not present

## 2024-01-16 DIAGNOSIS — M21372 Foot drop, left foot: Secondary | ICD-10-CM | POA: Diagnosis not present

## 2024-01-23 DIAGNOSIS — M21372 Foot drop, left foot: Secondary | ICD-10-CM | POA: Diagnosis not present

## 2024-01-25 ENCOUNTER — Other Ambulatory Visit: Payer: Self-pay | Admitting: Family Medicine

## 2024-01-30 DIAGNOSIS — M21372 Foot drop, left foot: Secondary | ICD-10-CM | POA: Diagnosis not present

## 2024-02-06 DIAGNOSIS — M21372 Foot drop, left foot: Secondary | ICD-10-CM | POA: Diagnosis not present

## 2024-02-09 DIAGNOSIS — H524 Presbyopia: Secondary | ICD-10-CM | POA: Diagnosis not present

## 2024-02-09 DIAGNOSIS — E119 Type 2 diabetes mellitus without complications: Secondary | ICD-10-CM | POA: Diagnosis not present

## 2024-02-09 LAB — HM DIABETES EYE EXAM

## 2024-02-10 ENCOUNTER — Encounter: Payer: Self-pay | Admitting: Family Medicine

## 2024-02-13 DIAGNOSIS — M21372 Foot drop, left foot: Secondary | ICD-10-CM | POA: Diagnosis not present

## 2024-02-27 DIAGNOSIS — M21372 Foot drop, left foot: Secondary | ICD-10-CM | POA: Diagnosis not present

## 2024-03-05 DIAGNOSIS — M21372 Foot drop, left foot: Secondary | ICD-10-CM | POA: Diagnosis not present

## 2024-03-07 ENCOUNTER — Other Ambulatory Visit: Payer: Self-pay | Admitting: Family Medicine

## 2024-03-07 DIAGNOSIS — N1831 Chronic kidney disease, stage 3a: Secondary | ICD-10-CM

## 2024-03-07 DIAGNOSIS — E119 Type 2 diabetes mellitus without complications: Secondary | ICD-10-CM

## 2024-03-12 DIAGNOSIS — M21372 Foot drop, left foot: Secondary | ICD-10-CM | POA: Diagnosis not present

## 2024-03-16 DIAGNOSIS — C787 Secondary malignant neoplasm of liver and intrahepatic bile duct: Secondary | ICD-10-CM | POA: Diagnosis not present

## 2024-03-16 DIAGNOSIS — C480 Malignant neoplasm of retroperitoneum: Secondary | ICD-10-CM | POA: Diagnosis not present

## 2024-03-16 DIAGNOSIS — R918 Other nonspecific abnormal finding of lung field: Secondary | ICD-10-CM | POA: Diagnosis not present

## 2024-03-19 DIAGNOSIS — M21372 Foot drop, left foot: Secondary | ICD-10-CM | POA: Diagnosis not present

## 2024-03-22 DIAGNOSIS — R918 Other nonspecific abnormal finding of lung field: Secondary | ICD-10-CM | POA: Diagnosis not present

## 2024-03-22 DIAGNOSIS — C480 Malignant neoplasm of retroperitoneum: Secondary | ICD-10-CM | POA: Diagnosis not present

## 2024-03-22 DIAGNOSIS — C787 Secondary malignant neoplasm of liver and intrahepatic bile duct: Secondary | ICD-10-CM | POA: Diagnosis not present

## 2024-03-22 DIAGNOSIS — I7143 Infrarenal abdominal aortic aneurysm, without rupture: Secondary | ICD-10-CM | POA: Diagnosis not present

## 2024-03-22 DIAGNOSIS — K8020 Calculus of gallbladder without cholecystitis without obstruction: Secondary | ICD-10-CM | POA: Diagnosis not present

## 2024-03-26 DIAGNOSIS — M21372 Foot drop, left foot: Secondary | ICD-10-CM | POA: Diagnosis not present

## 2024-03-30 ENCOUNTER — Other Ambulatory Visit: Payer: Self-pay

## 2024-03-30 MED ORDER — OMEGA-3-ACID ETHYL ESTERS 1 G PO CAPS: 2.0000 g | ORAL_CAPSULE | Freq: Two times a day (BID) | ORAL | 0 refills | Status: DC

## 2024-03-30 MED ORDER — IRBESARTAN-HYDROCHLOROTHIAZIDE 300-12.5 MG PO TABS: 1.0000 | ORAL_TABLET | Freq: Every day | ORAL | 0 refills | Status: DC

## 2024-03-30 NOTE — Telephone Encounter (Signed)
 Refill sent to pharmacy.

## 2024-04-02 DIAGNOSIS — M21372 Foot drop, left foot: Secondary | ICD-10-CM | POA: Diagnosis not present

## 2024-04-05 ENCOUNTER — Other Ambulatory Visit: Payer: Self-pay | Admitting: Family Medicine

## 2024-04-05 MED ORDER — TRAZODONE HCL 100 MG PO TABS: 100.0000 mg | ORAL_TABLET | Freq: Every day | ORAL | 3 refills | Status: AC

## 2024-04-25 ENCOUNTER — Other Ambulatory Visit: Payer: Self-pay | Admitting: Family Medicine

## 2024-05-25 DIAGNOSIS — C7802 Secondary malignant neoplasm of left lung: Secondary | ICD-10-CM | POA: Diagnosis not present

## 2024-05-25 DIAGNOSIS — R918 Other nonspecific abnormal finding of lung field: Secondary | ICD-10-CM | POA: Diagnosis not present

## 2024-05-25 DIAGNOSIS — C7801 Secondary malignant neoplasm of right lung: Secondary | ICD-10-CM | POA: Diagnosis not present

## 2024-05-25 DIAGNOSIS — C480 Malignant neoplasm of retroperitoneum: Secondary | ICD-10-CM | POA: Diagnosis not present

## 2024-05-25 DIAGNOSIS — C787 Secondary malignant neoplasm of liver and intrahepatic bile duct: Secondary | ICD-10-CM | POA: Diagnosis not present

## 2024-05-25 DIAGNOSIS — K8689 Other specified diseases of pancreas: Secondary | ICD-10-CM | POA: Diagnosis not present

## 2024-05-25 DIAGNOSIS — R590 Localized enlarged lymph nodes: Secondary | ICD-10-CM | POA: Diagnosis not present

## 2024-06-14 ENCOUNTER — Other Ambulatory Visit: Payer: Self-pay

## 2024-06-14 MED ORDER — LEVOTHYROXINE SODIUM 50 MCG PO TABS: 50.0000 ug | ORAL_TABLET | Freq: Every day | ORAL | 0 refills | Status: DC

## 2024-06-21 ENCOUNTER — Ambulatory Visit (INDEPENDENT_AMBULATORY_CARE_PROVIDER_SITE_OTHER)
Admission: RE | Admit: 2024-06-21 | Discharge: 2024-06-21 | Disposition: A | Discharge status: Home / Self Care | Source: Ambulatory Visit | Attending: Family Medicine | Admitting: Family Medicine

## 2024-06-21 ENCOUNTER — Encounter: Payer: Self-pay | Admitting: Family Medicine

## 2024-06-21 ENCOUNTER — Ambulatory Visit: Payer: Medicare PPO | Admitting: Family Medicine

## 2024-06-21 ENCOUNTER — Ambulatory Visit: Payer: Self-pay | Admitting: Family Medicine

## 2024-06-21 VITALS — BP 138/80 | HR 74 | Temp 98.2°F | Ht 68.0 in | Wt 166.0 lb

## 2024-06-21 DIAGNOSIS — M51361 Other intervertebral disc degeneration, lumbar region with lower extremity pain only: Secondary | ICD-10-CM | POA: Diagnosis not present

## 2024-06-21 DIAGNOSIS — E1121 Type 2 diabetes mellitus with diabetic nephropathy: Secondary | ICD-10-CM

## 2024-06-21 DIAGNOSIS — M5432 Sciatica, left side: Secondary | ICD-10-CM | POA: Diagnosis not present

## 2024-06-21 DIAGNOSIS — E782 Mixed hyperlipidemia: Secondary | ICD-10-CM | POA: Diagnosis not present

## 2024-06-21 DIAGNOSIS — E119 Type 2 diabetes mellitus without complications: Secondary | ICD-10-CM

## 2024-06-21 DIAGNOSIS — M47816 Spondylosis without myelopathy or radiculopathy, lumbar region: Secondary | ICD-10-CM | POA: Diagnosis not present

## 2024-06-21 DIAGNOSIS — N1832 Chronic kidney disease, stage 3b: Secondary | ICD-10-CM | POA: Insufficient documentation

## 2024-06-21 DIAGNOSIS — I10 Essential (primary) hypertension: Secondary | ICD-10-CM | POA: Diagnosis not present

## 2024-06-21 DIAGNOSIS — E038 Other specified hypothyroidism: Secondary | ICD-10-CM | POA: Diagnosis not present

## 2024-06-21 DIAGNOSIS — C480 Malignant neoplasm of retroperitoneum: Secondary | ICD-10-CM

## 2024-06-21 DIAGNOSIS — M419 Scoliosis, unspecified: Secondary | ICD-10-CM | POA: Diagnosis not present

## 2024-06-21 DIAGNOSIS — M5126 Other intervertebral disc displacement, lumbar region: Secondary | ICD-10-CM | POA: Diagnosis not present

## 2024-06-21 LAB — CBC WITH DIFFERENTIAL/PLATELET
Basophils Absolute: 0 x10E3/uL (ref 0.0–0.2)
Basos: 1 %
EOS (ABSOLUTE): 0.2 x10E3/uL (ref 0.0–0.4)
Eos: 4 %
Hematocrit: 35.8 % (ref 34.0–46.6)
Hemoglobin: 11.4 g/dL (ref 11.1–15.9)
Immature Grans (Abs): 0 x10E3/uL (ref 0.0–0.1)
Immature Granulocytes: 0 %
Lymphocytes Absolute: 1.8 x10E3/uL (ref 0.7–3.1)
Lymphs: 39 %
MCH: 29.4 pg (ref 26.6–33.0)
MCHC: 31.8 g/dL (ref 31.5–35.7)
MCV: 92 fL (ref 79–97)
Monocytes Absolute: 0.4 x10E3/uL (ref 0.1–0.9)
Monocytes: 10 %
Neutrophils Absolute: 2.1 x10E3/uL (ref 1.4–7.0)
Neutrophils: 46 %
Platelets: 195 x10E3/uL (ref 150–450)
RBC: 3.88 x10E6/uL (ref 3.77–5.28)
RDW: 13.3 % (ref 11.7–15.4)
WBC: 4.6 x10E3/uL (ref 3.4–10.8)

## 2024-06-21 LAB — COMPREHENSIVE METABOLIC PANEL WITH GFR
ALT: 24 IU/L (ref 0–32)
AST: 32 IU/L (ref 0–40)
Albumin: 4.5 g/dL (ref 3.8–4.8)
Alkaline Phosphatase: 123 IU/L — ABNORMAL HIGH (ref 44–121)
BUN/Creatinine Ratio: 11 — ABNORMAL LOW (ref 12–28)
BUN: 15 mg/dL (ref 8–27)
Bilirubin Total: 0.2 mg/dL (ref 0.0–1.2)
CO2: 24 mmol/L (ref 20–29)
Calcium: 10.2 mg/dL (ref 8.7–10.3)
Chloride: 101 mmol/L (ref 96–106)
Creatinine, Ser: 1.36 mg/dL — ABNORMAL HIGH (ref 0.57–1.00)
Globulin, Total: 2.3 g/dL (ref 1.5–4.5)
Glucose: 114 mg/dL — ABNORMAL HIGH (ref 70–99)
Potassium: 4.4 mmol/L (ref 3.5–5.2)
Sodium: 140 mmol/L (ref 134–144)
Total Protein: 6.8 g/dL (ref 6.0–8.5)
eGFR: 39 mL/min/1.73 — ABNORMAL LOW (ref >59)

## 2024-06-21 LAB — LIPID PANEL
Chol/HDL Ratio: 2.7 ratio (ref 0.0–4.4)
Cholesterol, Total: 160 mg/dL (ref 100–199)
HDL: 60 mg/dL (ref >39)
LDL Chol Calc (NIH): 77 mg/dL (ref 0–99)
Triglycerides: 134 mg/dL (ref 0–149)
VLDL Cholesterol Cal: 23 mg/dL (ref 5–40)

## 2024-06-21 LAB — HEMOGLOBIN A1C
Est. average glucose Bld gHb Est-mCnc: 126 mg/dL
Hgb A1c MFr Bld: 6 % — ABNORMAL HIGH (ref 4.8–5.6)

## 2024-06-21 MED ORDER — GABAPENTIN 300 MG PO CAPS: 300.0000 mg | ORAL_CAPSULE | Freq: Three times a day (TID) | ORAL | Status: DC

## 2024-06-21 MED ORDER — PREDNISONE 50 MG PO TABS
50.0000 mg | ORAL_TABLET | Freq: Every day | ORAL | 0 refills | Status: DC
Start: 2024-06-21 — End: 2024-09-27

## 2024-06-21 NOTE — Assessment & Plan Note (Signed)
 Previously well controlled Continue Synthroid  50 mcg once daily at current dose

## 2024-06-21 NOTE — Assessment & Plan Note (Signed)
Continue management with Dr. Lewis and Dr. Shen.  

## 2024-06-21 NOTE — Assessment & Plan Note (Signed)
 On Metformin, not checking sugars regularly, but A1C has been acceptable. -Continue Metformin. -Check A1C.

## 2024-06-21 NOTE — Assessment & Plan Note (Signed)
 Stable

## 2024-06-21 NOTE — Progress Notes (Signed)
 Subjective:  Patient ID: Katherine Richmond, female    DOB: 13-Oct-1941  Age: 83 y.o. MRN: [id number]  Chief Complaint  Patient presents with   Medical Management of Chronic Issues    Discussed the use of AI scribe software for clinical note transcription with the patient, who gave verbal consent to proceed.  History of Present Illness   Katherine Richmond is an 83 year old female with diabetes who presents with left hip pain and recent fall.  Left hip and lower extremity pain - Persistent, severe pain localized to the left hip, radiating down the leg to the foot - Pain intensity rated as 9.75 out of 10 with activity - Pain persists throughout the day - Associated with limping and significant mobility impairment - No relief with over-the-counter pain medications - History of sciatica, but uncertain if current pain is similar - Takes gabapentin  300 mg twice daily for nerve pain, obtained from her partner's supply as he was taken off it.  Recent fall and associated injuries - Fell in mid-July, striking her head and injuring her knee - No loss of consciousness at the time of the fall - Persistent headache since the fall - Left knee is scraped and bruised, without internal pain - Attributes the fall to difficulty lifting her left foot, described as 'drop foot'  Neurological symptoms - Difficulty lifting the left foot, described as 'drop foot' - No bowel or bladder dysfunction    Diabetes:  Complications: Hyperlipidemia, Hypertension. Glucose checking: not checking. Most recent A1C: 6.2% Current medications: Metformin  1000 mg daily. Last Eye Exam: [facility] in March 2025.  Foot checks: daily   Hyperlipidemia: Current medications: Atorvastatin  40 mg daily, Omega-3 1000 mg twice a day. Eating healthy. exercising   Hypertension: Complications: Diabetes, Hypertension. Current medications: Irbesartan -Hctz 300-12.5 mg daily, Metoprolol  100 mg daily, Aspirin 81 mg  daily.   Hypothyroidism: She is taking Levothyroxine  50 mcg daily.   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 Has been on chemo since October 2023. Patient is on a break from chemo.       06/21/2024    7:58 AM 08/25/2023    9:28 AM 07/28/2023    8:44 AM 01/27/2023   10:19 AM 06/19/2021    4:02 PM  Depression screen PHQ 2/9  Decreased Interest 0 0 1 0 0  Down, Depressed, Hopeless 0 0 0 0 0  PHQ - 2 Score 0 0 1 0 0  Altered sleeping 0 0 2    Tired, decreased energy 1 0 1    Change in appetite 0 0 0    Feeling bad or failure about yourself  0 0 0    Trouble concentrating 0 0 0    Moving slowly or fidgety/restless 0 0 0    Suicidal thoughts 0 0 0    PHQ-9 Score 1 0 4    Difficult doing work/chores Not difficult at all Not difficult at all Not difficult at all          06/21/2024    7:58 AM  Fall Risk   Falls in the past year? 1  Number falls in past yr: 0  Injury with Fall? 0  Risk for fall due to : No Fall Risks  Follow up Falls evaluation completed    Patient Care Team: Sherre Clapper, MD as PCP - General (Family Medicine) Debora Kiang, MD as Surgical Oncologist (Oncology) Ezzard Valaria LABOR, MD as Consulting Physician (Oncology) Delores Lauraine NOVAK, [facility] (  Inactive) as Pharmacist (Pharmacist) Charissa Slough, DO (Obstetrics and Gynecology)   Review of Systems  Constitutional:  Negative for chills, fatigue and fever.  HENT:  Negative for congestion, ear pain, rhinorrhea and sore throat.   Respiratory:  Negative for cough and shortness of breath.   Cardiovascular:  Negative for chest pain.  Gastrointestinal:  Negative for abdominal pain, constipation, diarrhea, nausea and vomiting.  Genitourinary:  Negative for dysuria and urgency.  Musculoskeletal:  Positive for arthralgias (Left Hip). Negative for back pain and myalgias.  Neurological:  Negative for dizziness, weakness, light-headedness and headaches.  Psychiatric/Behavioral:  Negative for dysphoric mood. The  patient is not nervous/anxious.     Current Outpatient Medications on File Prior to Visit  Medication Sig Dispense Refill   aspirin 81 MG EC tablet Take 81 mg by mouth daily.     atorvastatin  (LIPITOR) 40 MG tablet Take 1 tablet (40 mg total) by mouth daily. 90 tablet 1   cetirizine  (ZYRTEC ) 10 MG tablet Take 1 tablet (10 mg total) by mouth at bedtime. 90 tablet 3   ferrous sulfate 325 (65 FE) MG tablet Take by mouth.     fluticasone  (FLONASE ) 50 MCG/ACT nasal spray Place 2 sprays into both nostrils daily. 48 g 1   irbesartan -hydrochlorothiazide  (AVALIDE) 300-12.5 MG tablet Take 1 tablet by mouth daily. 90 tablet 0   levothyroxine  (SYNTHROID ) 50 MCG tablet Take 1 tablet (50 mcg total) by mouth daily. 90 tablet 0   metFORMIN  (GLUCOPHAGE ) 1000 MG tablet Take 1 tablet by mouth daily. 90 tablet 1   metoprolol  succinate (TOPROL -XL) 100 MG 24 hr tablet Take 1 tablet (100 mg total) by mouth daily. 90 tablet 1   Multiple Vitamins-Minerals (MULTIPLE VITAMINS/WOMENS PO) Take by mouth.     omega-3 acid ethyl esters (LOVAZA ) 1 g capsule Take 2 capsules (2 g total) by mouth 2 (two) times daily. 360 capsule 0   traZODone  (DESYREL ) 100 MG tablet Take 1 tablet (100 mg total) by mouth at bedtime. 90 tablet 3   traZODone  (DESYREL ) 50 MG tablet Take 1 tablet (50 mg total) by mouth at bedtime. 90 tablet 1   No current facility-administered medications on file prior to visit.   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   TONSILLECTOMY  2002    Family History  Problem Relation Age of Onset   Breast cancer Mother    Colon cancer Mother    Renal cancer Mother    Liver cancer Half-Brother    Multiple sclerosis Half-Brother    Hypertension Other    Transient ischemic attack Other    Glaucoma Other    Social History   Socioeconomic History   Marital status: Married    Spouse name: ZACHARY Odaniel   Number of children: 1   Years of education: Not on  file   Highest education level: Not on file  Occupational History   Occupation: RETIRED   Occupation: [employer], FRI  Tobacco Use   Smoking status: Former    Current packs/day: 0.00    Types: Cigarettes    Quit date: 2012    Years since quitting: 13.6   Smokeless tobacco: Never  Substance and Sexual Activity   Alcohol use: Yes    Alcohol/week: 1.0 standard drink of alcohol    Types: 1 Glasses of wine per week    Comment: SOCIAL USE ONLY   Drug use: Never   Sexual activity: Not on file  Other Topics Concern   Not on file  Social History Narrative   Not on file   Social Drivers of Health   Financial Resource Strain: Low Risk  (08/25/2023)   Overall Financial Resource Strain (CARDIA)    Difficulty of Paying Living Expenses: Not hard at all  Food Insecurity: No Food Insecurity (06/21/2024)   Hunger Vital Sign    Worried About Running Out of Food in the Last Year: Never true    Ran Out of Food in the Last Year: Never true  Transportation Needs: No Transportation Needs (06/21/2024)   PRAPARE - Administrator, Civil Service (Medical): No    Lack of Transportation (Non-Medical): No  Physical Activity: Insufficiently Active (08/25/2023)   Exercise Vital Sign    Days of Exercise per Week: 2 days    Minutes of Exercise per Session: 30 min  Stress: Stress Concern Present (08/25/2023)    [employer] of Occupational Health - Occupational Stress Questionnaire    Feeling of Stress : To some extent  Social Connections: Moderately Integrated (08/25/2023)   Social Connection and Isolation Panel    Frequency of Communication with Friends and Family: More than three times a week    Frequency of Social Gatherings with Friends and Family: More than three times a week    Attends Religious Services: 1 to 4 times per year    Active Member of [employer] or Organizations: No    Attends Banker Meetings: Never    Marital Status: Married    Objective:  BP 138/80   Pulse 74   Temp 98.2 F (36.8 C)   Ht 5' 8 (1.727 m)   Wt 166 lb (75.3 kg)   SpO2 99%   BMI 25.24 kg/m      06/21/2024    7:52 AM 12/15/2023    9:22 AM 09/04/2023   11:42 AM  BP/Weight  Systolic BP 138 138 134  Diastolic BP 80 80 62  Wt. (Lbs) 166 161 161  BMI 25.24 kg/m2 24.48 kg/m2 24.48 kg/m2    Physical Exam Vitals reviewed.  Constitutional:      Appearance: Normal appearance. She is normal weight.  Neck:     Vascular: No carotid bruit.  Cardiovascular:     Rate and Rhythm: Normal rate and regular rhythm.     Pulses: Normal pulses.     Heart sounds: Normal heart sounds.  Pulmonary:     Effort: Pulmonary effort is normal. No respiratory distress.     Breath sounds: Normal breath sounds.  Abdominal:     General: Abdomen is flat. Bowel sounds are normal.     Palpations: Abdomen is soft.     Tenderness: There is no abdominal tenderness.  Musculoskeletal:        General: Tenderness (left buttock (swelling)) present.     Comments: Drop  left foot.  Negative SLR BL.     Neurological:     Mental Status: She is alert and oriented to person, place, and time.     Deep Tendon Reflexes:     Reflex Scores:      Patellar reflexes are 3+ on the right side and 3+ on the left side. Psychiatric:        Mood and Affect: Mood normal.        Behavior: Behavior normal.      Diabetic foot exam  was performed with the following findings:   No deformities, ulcerations, or other skin breakdown Normal sensation of 10g monofilament Intact posterior tibialis and dorsalis pedis pulses      Lab Results  Component Value Date   WBC 4.6 06/21/2024   HGB 11.4 06/21/2024   HCT 35.8 06/21/2024   PLT 195 06/21/2024   GLUCOSE 114 (H) 06/21/2024   CHOL 160 06/21/2024   TRIG 134 06/21/2024   HDL 60 06/21/2024   LDLCALC 77 06/21/2024   ALT 24 06/21/2024   AST 32 06/21/2024   NA 140 06/21/2024   K 4.4 06/21/2024   CL 101 06/21/2024   CREATININE 1.36 (H) 06/21/2024   BUN 15 06/21/2024   CO2 24 06/21/2024   TSH 1.530 02/03/2023   HGBA1C 6.0 (H) 06/21/2024   MICROALBUR 10 08/06/2021      Assessment & Plan:  Secondary hypothyroidism Assessment & Plan: Previously well controlled Continue Synthroid  50 mcg once daily at current dose    Mixed hyperlipidemia Assessment & Plan: On Atorvastatin  and prescription fish oil (Lovaza ). -Continue current regimen.  Orders: -     Lipid panel  Diabetic glomerulopathy (HCC) Assessment & Plan: On Metformin , not checking sugars regularly, but A1C has been acceptable. -Continue Metformin . -Check A1C.  Orders: -     Hemoglobin A1c  Hypertension complicating diabetes (HCC) Assessment & Plan: Blood pressure slightly elevated at the visit, but within acceptable range for the patient. -Continue current antihypertensive regimen (Irbesartan /Hydrochlorothiazide , Metoprolol ).  Diabetes and hypertension Control: good Recommend check sugars fasting daily. Recommend check feet daily. Recommend annual eye exams. Medicines: Metformin  1000 mg daily, Irbesartan -Hctz 300-12.5 mg daily, Metoprolol  100 mg daily, Aspirin 81 mg daily. Continue to work on eating a healthy diet and exercise.  Labs drawn today.     Orders: -     CBC with Differential/Platelet -     Comprehensive metabolic panel with GFR  Sciatica of left side Assessment &  Plan: Prednisone  50 mg daily x 5 days.  Increase gabapentin  to 300 mg three times a day.  If in two weeks you are not improving, gradually increase gabapentin  to 600 mg three times a day over a couple of weeks.   Go to the med center for xray of spine.   Orders: -     predniSONE ; Take 1 tablet (50 mg total) by mouth daily with breakfast.  Dispense: 5 tablet; Refill: 0 -     DG Lumbar Spine Complete; Future -     Gabapentin ; Take 1 capsule (300 mg total) by mouth 3 (three) times daily.  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 Ezzard and Dr. Debora.    Stage 3b chronic kidney disease (HCC) Assessment & Plan: Stable.      Meds ordered this encounter  Medications   predniSONE  (DELTASONE ) 50 MG tablet    Sig: Take 1 tablet (50 mg total) by mouth daily with breakfast.    Dispense:  5  tablet    Refill:  0   gabapentin  (NEURONTIN ) 300 MG capsule    Sig: Take 1 capsule (300 mg total) by mouth 3 (three) times daily.    Orders Placed This Encounter  Procedures   DG Lumbar Spine Complete   Lipid panel   Hemoglobin A1c   CBC with Differential/Platelet   Comprehensive metabolic panel     Follow-up: Return in about 3 months (around 09/21/2024) for chronic follow up.  An After Visit Summary was printed and given to the patient.  Abigail Free, MD [facility] [phone]

## 2024-06-21 NOTE — Assessment & Plan Note (Signed)
 Blood pressure slightly elevated at the visit, but within acceptable range for the patient. -Continue current antihypertensive regimen (Irbesartan /Hydrochlorothiazide , Metoprolol ).  Diabetes and hypertension Control: good Recommend check sugars fasting daily. Recommend check feet daily. Recommend annual eye exams. Medicines: Metformin  1000 mg daily, Irbesartan -Hctz 300-12.5 mg daily, Metoprolol  100 mg daily, Aspirin 81 mg daily. Continue to work on eating a healthy diet and exercise.  Labs drawn today.

## 2024-06-21 NOTE — Assessment & Plan Note (Signed)
 Prednisone  50 mg daily x 5 days.  Increase gabapentin  to 300 mg three times a day.  If in two weeks you are not improving, gradually increase gabapentin  to 600 mg three times a day over a couple of weeks.   Go to the med center for xray of spine.

## 2024-06-21 NOTE — Patient Instructions (Signed)
 Prednisone  50 mg daily x 5 days.  Increase gabapentin  to 300 mg three times a day.  If in two weeks you are not improving, gradually increase gabapentin  to 600 mg three times a day over a couple of weeks.   Go to the med center for xray of spine.

## 2024-06-21 NOTE — Assessment & Plan Note (Signed)
 On Atorvastatin and prescription fish oil (Lovaza). -Continue current regimen.

## 2024-06-28 ENCOUNTER — Telehealth: Payer: Self-pay

## 2024-06-28 ENCOUNTER — Other Ambulatory Visit: Payer: Self-pay | Admitting: Family Medicine

## 2024-06-28 MED ORDER — TRAMADOL HCL 50 MG PO TABS: 50.0000 mg | ORAL_TABLET | Freq: Three times a day (TID) | ORAL | 0 refills | Status: AC | PRN

## 2024-06-28 NOTE — Telephone Encounter (Signed)
 Copied from CRM #[id number]. Topic: Clinical - Prescription Issue >> Jun 28, 2024 11:29 AM Zebedee SAUNDERS wrote: Reason for CRM: Pt stated gabapentin  (NEURONTIN ) 300 MG capsule and predniSONE  (DELTASONE ) 50 MG tablet are not effective on pain. She would like something stronger. Please call pt at [phone].

## 2024-06-28 NOTE — Telephone Encounter (Signed)
 Patient aware and verbalized understanding.

## 2024-07-01 ENCOUNTER — Other Ambulatory Visit: Payer: Self-pay | Admitting: Family Medicine

## 2024-07-06 ENCOUNTER — Telehealth: Payer: Self-pay

## 2024-07-06 ENCOUNTER — Other Ambulatory Visit: Payer: Self-pay

## 2024-07-06 DIAGNOSIS — N1831 Chronic kidney disease, stage 3a: Secondary | ICD-10-CM

## 2024-07-06 DIAGNOSIS — E119 Type 2 diabetes mellitus without complications: Secondary | ICD-10-CM

## 2024-07-06 MED ORDER — LEVOTHYROXINE SODIUM 50 MCG PO TABS
50.0000 ug | ORAL_TABLET | Freq: Every day | ORAL | 0 refills | Status: DC
Start: 2024-07-06 — End: 2024-07-12

## 2024-07-06 MED ORDER — METOPROLOL SUCCINATE ER 100 MG PO TB24: 100.0000 mg | ORAL_TABLET | Freq: Every day | ORAL | 1 refills | Status: AC

## 2024-07-06 NOTE — Telephone Encounter (Signed)
 Copied from CRM [id number]. Topic: Clinical - Prescription Issue >> Jul 06, 2024 11:49 AM Gustabo D wrote: levothyroxine  (SYNTHROID ) 50 MCG tablet- Needs new prescription and is out of it

## 2024-07-07 NOTE — Progress Notes (Signed)
   07/07/2024  Patient ID: Katherine Richmond, female   DOB: 01/29/1941, 83 y.o.   MRN: [id number]  Pharmacy Quality Measure Review  This patient is appearing on a report for being at risk of failing the adherence measure for hypertension (ACEi/ARB) medications this calendar year.   Medication: metformin  Last fill date: 04/19/2024 for 90 day supply  Insurance report was not up to date. No action needed at this time.   Lang Sieve, PharmD, BCGP Clinical Pharmacist  [phone]

## 2024-07-12 ENCOUNTER — Other Ambulatory Visit: Payer: Self-pay

## 2024-07-12 MED ORDER — LEVOTHYROXINE SODIUM 50 MCG PO TABS: 50.0000 ug | ORAL_TABLET | Freq: Every day | ORAL | 0 refills | Status: AC

## 2024-07-15 ENCOUNTER — Other Ambulatory Visit: Payer: Self-pay | Admitting: Family Medicine

## 2024-07-19 ENCOUNTER — Other Ambulatory Visit: Payer: Self-pay | Admitting: Family Medicine

## 2024-07-19 ENCOUNTER — Ambulatory Visit (INDEPENDENT_AMBULATORY_CARE_PROVIDER_SITE_OTHER)

## 2024-07-19 ENCOUNTER — Telehealth: Payer: Self-pay | Admitting: Family Medicine

## 2024-07-19 DIAGNOSIS — Z23 Encounter for immunization: Secondary | ICD-10-CM

## 2024-07-19 DIAGNOSIS — M5432 Sciatica, left side: Secondary | ICD-10-CM

## 2024-07-19 MED ORDER — HYDROCODONE-ACETAMINOPHEN 5-325 MG PO TABS: 1.0000 | ORAL_TABLET | Freq: Two times a day (BID) | ORAL | 0 refills | Status: DC | PRN

## 2024-07-19 NOTE — Telephone Encounter (Signed)
 She is continuing to have severe back pain with sciatica.  Had course of prednisone  without improvement.  Up to gabapentin  600 mg twice daily without improvement.  Taking tramadol  2-3 at a time without alleviation of pain.  Had physical therapy with worsening pain and could not continue.   Order mri of lumbar spine.  Order hydrocodone /apap 5/325 mg one oral twice daily as needed severe pain.   Dr. Sherre

## 2024-07-21 ENCOUNTER — Ambulatory Visit (INDEPENDENT_AMBULATORY_CARE_PROVIDER_SITE_OTHER)
Admission: RE | Admit: 2024-07-21 | Discharge: 2024-07-21 | Disposition: A | Discharge status: Home / Self Care | Source: Ambulatory Visit | Attending: Family Medicine | Admitting: Family Medicine

## 2024-07-21 DIAGNOSIS — M4316 Spondylolisthesis, lumbar region: Secondary | ICD-10-CM | POA: Diagnosis not present

## 2024-07-21 DIAGNOSIS — M419 Scoliosis, unspecified: Secondary | ICD-10-CM | POA: Diagnosis not present

## 2024-07-21 DIAGNOSIS — M5432 Sciatica, left side: Secondary | ICD-10-CM

## 2024-07-21 DIAGNOSIS — M48061 Spinal stenosis, lumbar region without neurogenic claudication: Secondary | ICD-10-CM | POA: Diagnosis not present

## 2024-07-21 DIAGNOSIS — M5116 Intervertebral disc disorders with radiculopathy, lumbar region: Secondary | ICD-10-CM | POA: Diagnosis not present

## 2024-07-26 ENCOUNTER — Other Ambulatory Visit: Payer: Self-pay | Admitting: Family Medicine

## 2024-07-26 ENCOUNTER — Ambulatory Visit: Payer: Self-pay | Admitting: Family Medicine

## 2024-07-26 DIAGNOSIS — M5432 Sciatica, left side: Secondary | ICD-10-CM

## 2024-08-02 ENCOUNTER — Other Ambulatory Visit: Payer: Self-pay

## 2024-08-02 ENCOUNTER — Other Ambulatory Visit: Payer: Self-pay | Admitting: Family Medicine

## 2024-08-02 ENCOUNTER — Ambulatory Visit: Admitting: Family Medicine

## 2024-08-02 MED ORDER — FLUTICASONE PROPIONATE 50 MCG/ACT NA SUSP: 2.0000 | Freq: Every day | NASAL | 1 refills | Status: AC

## 2024-08-03 DIAGNOSIS — K869 Disease of pancreas, unspecified: Secondary | ICD-10-CM | POA: Diagnosis not present

## 2024-08-03 DIAGNOSIS — R1909 Other intra-abdominal and pelvic swelling, mass and lump: Secondary | ICD-10-CM | POA: Diagnosis not present

## 2024-08-03 DIAGNOSIS — Z87891 Personal history of nicotine dependence: Secondary | ICD-10-CM | POA: Diagnosis not present

## 2024-08-03 DIAGNOSIS — C787 Secondary malignant neoplasm of liver and intrahepatic bile duct: Secondary | ICD-10-CM | POA: Diagnosis not present

## 2024-08-03 DIAGNOSIS — C480 Malignant neoplasm of retroperitoneum: Secondary | ICD-10-CM | POA: Diagnosis not present

## 2024-08-03 DIAGNOSIS — R918 Other nonspecific abnormal finding of lung field: Secondary | ICD-10-CM | POA: Diagnosis not present

## 2024-08-05 DIAGNOSIS — Z6826 Body mass index (BMI) 26.0-26.9, adult: Secondary | ICD-10-CM | POA: Diagnosis not present

## 2024-08-05 DIAGNOSIS — M5416 Radiculopathy, lumbar region: Secondary | ICD-10-CM | POA: Diagnosis not present

## 2024-08-11 ENCOUNTER — Encounter: Payer: Self-pay | Admitting: Family Medicine

## 2024-08-12 ENCOUNTER — Other Ambulatory Visit: Payer: Self-pay | Admitting: Family Medicine

## 2024-08-12 DIAGNOSIS — C480 Malignant neoplasm of retroperitoneum: Secondary | ICD-10-CM

## 2024-08-12 DIAGNOSIS — M5432 Sciatica, left side: Secondary | ICD-10-CM

## 2024-08-12 DIAGNOSIS — I129 Hypertensive chronic kidney disease with stage 1 through stage 4 chronic kidney disease, or unspecified chronic kidney disease: Secondary | ICD-10-CM

## 2024-08-12 DIAGNOSIS — E119 Type 2 diabetes mellitus without complications: Secondary | ICD-10-CM

## 2024-08-16 ENCOUNTER — Telehealth: Payer: Self-pay

## 2024-08-16 NOTE — Progress Notes (Signed)
 Complex Care Management Note  Care Guide Note 08/16/2024 Name: Katherine Richmond MRN: [id number] DOB: 1941/04/13  Katherine Richmond is a 83 y.o. year old female who sees Cox, Abigail, MD for primary care. I reached out to Katherine Richmond by phone today to offer complex care management services.  Katherine Richmond was given information about Complex Care Management services today including:   The Complex Care Management services include support from the care team which includes your Nurse Care Manager, Clinical Social Worker, or Pharmacist.  The Complex Care Management team is here to help remove barriers to the health concerns and goals most important to you. Complex Care Management services are voluntary, and the patient may decline or stop services at any time by request to their care team member.   Complex Care Management Consent Status: Patient agreed to services and verbal consent obtained.   Follow up plan:  Telephone appointment with complex care management team member scheduled for:  08/17/24 & 08/30/24.  Encounter Outcome:  Patient Scheduled  Dreama [facility]  [facility], [facility] VBCI Assistant Direct Dial: [phone]  Fax: [phone]

## 2024-08-17 ENCOUNTER — Other Ambulatory Visit: Payer: Self-pay | Admitting: Licensed Clinical Social Worker

## 2024-08-17 DIAGNOSIS — C779 Secondary and unspecified malignant neoplasm of lymph node, unspecified: Secondary | ICD-10-CM | POA: Diagnosis not present

## 2024-08-17 DIAGNOSIS — E1122 Type 2 diabetes mellitus with diabetic chronic kidney disease: Secondary | ICD-10-CM | POA: Diagnosis not present

## 2024-08-17 DIAGNOSIS — E1142 Type 2 diabetes mellitus with diabetic polyneuropathy: Secondary | ICD-10-CM | POA: Diagnosis not present

## 2024-08-17 DIAGNOSIS — E785 Hyperlipidemia, unspecified: Secondary | ICD-10-CM | POA: Diagnosis not present

## 2024-08-17 DIAGNOSIS — D84821 Immunodeficiency due to drugs: Secondary | ICD-10-CM | POA: Diagnosis not present

## 2024-08-17 DIAGNOSIS — M1990 Unspecified osteoarthritis, unspecified site: Secondary | ICD-10-CM | POA: Diagnosis not present

## 2024-08-17 DIAGNOSIS — N1832 Chronic kidney disease, stage 3b: Secondary | ICD-10-CM | POA: Diagnosis not present

## 2024-08-17 DIAGNOSIS — C3490 Malignant neoplasm of unspecified part of unspecified bronchus or lung: Secondary | ICD-10-CM | POA: Diagnosis not present

## 2024-08-17 DIAGNOSIS — I700 Atherosclerosis of aorta: Secondary | ICD-10-CM | POA: Diagnosis not present

## 2024-08-17 NOTE — Patient Instructions (Signed)
 Visit Information  Thank you for taking time to visit with me today. Please don't hesitate to contact me if I can be of assistance to you before our next scheduled appointment.  Our next appointment is no further scheduled appointments.  Please call the care guide team at [phone] if you need to cancel or reschedule your appointment.   Following is a copy of your care plan:   Goals Addressed   None     Please call the Suicide and Crisis Lifeline: 988 call the [place]  National Suicide Prevention Lifeline: 720-664-2569 or TTY: (678)698-9621 TTY 8175228257) to talk to a trained counselor call 1-800-273-TALK (toll free, 24 hour hotline) call 911 if you are experiencing a Mental Health or Behavioral Health Crisis or need someone to talk to.  Patient verbalizes understanding of instructions and care plan provided today and agrees to view in MyChart. Active MyChart status and patient understanding of how to access instructions and care plan via MyChart confirmed with patient.     Tobias CHARM Maranda HEDWIG, PhD [facility], [facility] Social Worker Direct Dial: [phone]  Fax: [phone]

## 2024-08-17 NOTE — Patient Outreach (Signed)
 Complex Care Management   Visit Note  08/17/2024  Name:  Katherine Richmond MRN: [id number] DOB: 22-Dec-1940  Situation: Referral received for Complex Care Management related to SDOH Barriers:  screening I obtained verbal consent from Patient.  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 SW completed the SDOH and no SDOH needs at this time, SW will close out and reminded patient about the [facility] appointment.   SDOH Interventions    Flowsheet Row Patient Outreach Telephone from 08/17/2024 in [place] [facility] Office Visit from 06/21/2024 in [facility] Office Visit from 08/25/2023 in [facility] Office Visit from 01/27/2023 in [facility]  SDOH Interventions      Food Insecurity Interventions Intervention Not Indicated Intervention Not Indicated Intervention Not Indicated --  Housing Interventions Intervention Not Indicated Intervention Not Indicated Intervention Not Indicated --  Transportation Interventions Intervention Not Indicated Intervention Not Indicated Intervention Not Indicated --  Utilities Interventions Intervention Not Indicated Intervention Not Indicated Intervention Not Indicated Intervention Not Indicated  Alcohol Usage Interventions -- -- Intervention Not Indicated (Score <7) --  Depression Interventions/Treatment  -- PHQ2-9 Score <4 Follow-up Not Indicated -- --  Financial Strain Interventions -- -- Intervention Not Indicated Intervention Not Indicated  Physical Activity Interventions -- -- Intervention Not Indicated --  Stress  Interventions -- -- Intervention Not Indicated Intervention Not Indicated  Social Connections Interventions -- -- Intervention Not Indicated Intervention Not Indicated  Health Literacy Interventions -- -- Intervention Not Indicated --       Recommendation:   none  Follow Up Plan:   Closing From:  Complex Care Management  Tobias CHARM Maranda HEDWIG, PhD [facility], [facility] Social Worker Direct Dial: [phone]  Fax: [phone]

## 2024-08-30 ENCOUNTER — Other Ambulatory Visit: Payer: Self-pay

## 2024-08-30 NOTE — Patient Instructions (Signed)
 Visit Information  Thank you for taking time to visit with me today. Please don't hesitate to contact me if I can be of assistance to you before our next scheduled appointment.  Our next appointment is by telephone on 09/13/24 at 9:30 am Please call the care guide team at [phone] if you need to cancel or reschedule your appointment.   Following is a copy of your care plan:   Goals Addressed             This Visit's Progress    VBCI RN Care Plan       Problems:  Chronic Disease Management support and education needs related to upcoming surgery support - patient is the primary caregiver for her husband. Patient was interested in respite type services for her husband while she is recovering from her surgery scheduled for 09/01/24. Pain due to degenerative disc disease(per patient planned disc removal on 09/01/24 Fall risk Care Giver stress-care coordinator with LCSW to talk with patient when she contact wife to follow up on patient's husband(who is also involved in [facility] Care Management services.  Goal: Over the next 90 days the Patient will continue to work with Medical Illustrator and/or Social Worker to address care management and care coordination needs related to upcoming surgery support  as evidenced by adherence to care management team scheduled appointments     work with social worker to address Community support related to the management of community resource needs as evidenced by review of electronic medical record and patient or social worker report      Degenerative disc disease(pain/upcoming surgery)Interventions:   Evaluation of current treatment plan related to upcoming surgery,   self-management and patient's adherence to plan as established by provider. Discussed plans with patient for ongoing care management follow up and provided patient with direct contact information for care management team Collaborated with clinical social worker, Cena Ligas, LCSW regarding  patient community resource needs regarding her husband respite care Discussed plans with patient for ongoing care management follow up and provided patient with direct contact information for care management team Screening for signs and symptoms of depression related to chronic disease state  Discussed fall prevention strategies and reiterated the importance of maintaining a status of free of falls since PCP visit  Assessed patient pain level and patient management of pain-takes tylenol  during te da and pain medication at night.  Patient Self-Care Activities:  Attend all scheduled provider appointments Call provider office for new concerns or questions  Take medications as prescribed   Work with the social worker to address care coordination needs and will continue to work with the clinical team to address health care and disease management related needs  Plan:  Telephone follow up appointment with care management team member scheduled for:  09/13/24 at 9:30 am           Please call the Suicide and Crisis Lifeline: 988 call the [place]  National Suicide Prevention Lifeline: (831) 616-3342 or TTY: 740-110-3239 TTY 616-696-2783) to talk to a trained counselor if you are experiencing a Mental Health or Behavioral Health Crisis or need someone to talk to.  Patient verbalized understanding of Care plan and visit instructions communicated this visit  Heddy Shutter, RN, MSN, BSN, CCM   [facility], Population Health Case Manager Phone: [phone]

## 2024-08-30 NOTE — Patient Outreach (Signed)
 Complex Care Management   Visit Note  08/30/2024  Name:  Katherine Richmond MRN: [id number] DOB: Feb 13, 1941  Situation: Referral received for Complex Care Management related to planned surgery, patient needs help with her husband, DM, Ca  I obtained verbal consent from Patient.  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 Patient reports she does not have the money to pay out of pocket She has been in contact with LCSW. Patient reports she has arranged for family and friends to help out with spouse's needs. 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 Hemoglobin A1C 6.0 decreased from 6.2. Patient with planned back surgery on 09/01/24. Patient Reported Symptoms:  Cognitive Cognitive Status: No symptoms reported      Neurological Neurological Review of Symptoms: No symptoms reported    HEENT HEENT Symptoms Reported: No symptoms reported      Cardiovascular Cardiovascular Symptoms Reported: No symptoms reported Does patient have uncontrolled Hypertension?:  (patient reports her blood pressure last checked was around 132/82, but unable to states the date.)    Respiratory Respiratory Symptoms Reported: No symptoms reported    Endocrine Endocrine Symptoms Reported: No symptoms reported Is patient diabetic?: Yes Is patient checking blood sugars at home?: No Endocrine Self-Management Outcome: 4 (good)  Gastrointestinal Gastrointestinal Symptoms Reported: No symptoms reported      Genitourinary  Genitourinary Symptoms Reported: No symptoms reported    Integumentary Integumentary Symptoms Reported: No symptoms reported    Musculoskeletal Musculoskelatal Symptoms Reviewed: Limited mobility, Muscle pain, Unsteady gait Additional Musculoskeletal Details: degerative disc disease, sugery scheduled for Wednesday. pain is 9/10 Musculoskeletal Management Strategies: Medication therapy, Routine screening Musculoskeletal Self-Management Outcome: 2 (bad) Musculoskeletal Comment: pain medication takes at night and take tylenol  during the day Falls in the past year?: Yes Number of falls in past year: 2 or more Was there an injury with Fall?: Yes (scarred up knee) Fall Risk Category Calculator: 3 Patient Fall Risk Level: High Fall Risk Patient at Risk for Falls Due to: History of fall(s)  Psychosocial Psychosocial Symptoms Reported: No symptoms reported Additional Psychological Details: Patient denies anxiety or depression, but expessed the is dealing with a lot with managing her health and her husbands health. RNCM briefly discussed caregiver stress and informed Patient that RNCM will ask LCSW to discuss at upcoming follow up call re: patient's spouse. Katherine Richmond states that she has family and friends coming in to help support her during her recovery phase.     Quality of Family Relationships: supportive, helpful Do you feel physically threatened by others?: No   There were no vitals filed for this visit.  Unable to complete medication review: Patient had to end call early to get spouse ready for an appointment.  Medications Reviewed Today   Medications were not reviewed in this encounter    Recommendation:   Attend appointments as scheduled Contact provider with health questions or concerns.  Follow Up Plan:   Telephone follow up appointment date/time:  09/13/24 at  9:30 am  Heddy Shutter, RN, MSN, BSN, CCM [place]  [facility], Population Health Case  Manager Phone: [phone]

## 2024-09-01 DIAGNOSIS — M21372 Foot drop, left foot: Secondary | ICD-10-CM | POA: Diagnosis not present

## 2024-09-01 DIAGNOSIS — M5116 Intervertebral disc disorders with radiculopathy, lumbar region: Secondary | ICD-10-CM | POA: Diagnosis not present

## 2024-09-01 DIAGNOSIS — M5416 Radiculopathy, lumbar region: Secondary | ICD-10-CM | POA: Diagnosis not present

## 2024-09-01 HISTORY — PX: HEMI-MICRODISCECTOMY LUMBAR LAMINECTOMY LEVEL 4: SHX5849

## 2024-09-04 ENCOUNTER — Other Ambulatory Visit: Payer: Self-pay | Admitting: Family Medicine

## 2024-09-07 ENCOUNTER — Other Ambulatory Visit: Payer: Self-pay

## 2024-09-07 DIAGNOSIS — M5432 Sciatica, left side: Secondary | ICD-10-CM

## 2024-09-08 MED ORDER — GABAPENTIN 300 MG PO CAPS: 300.0000 mg | ORAL_CAPSULE | Freq: Three times a day (TID) | ORAL | 2 refills | Status: AC

## 2024-09-13 ENCOUNTER — Telehealth: Payer: Self-pay

## 2024-09-13 DIAGNOSIS — K769 Liver disease, unspecified: Secondary | ICD-10-CM | POA: Diagnosis not present

## 2024-09-13 DIAGNOSIS — E896 Postprocedural adrenocortical (-medullary) hypofunction: Secondary | ICD-10-CM | POA: Diagnosis not present

## 2024-09-13 DIAGNOSIS — R918 Other nonspecific abnormal finding of lung field: Secondary | ICD-10-CM | POA: Diagnosis not present

## 2024-09-13 DIAGNOSIS — N281 Cyst of kidney, acquired: Secondary | ICD-10-CM | POA: Diagnosis not present

## 2024-09-13 DIAGNOSIS — C480 Malignant neoplasm of retroperitoneum: Secondary | ICD-10-CM | POA: Diagnosis not present

## 2024-09-13 DIAGNOSIS — Z905 Acquired absence of kidney: Secondary | ICD-10-CM | POA: Diagnosis not present

## 2024-09-13 NOTE — Patient Outreach (Signed)
 Complex Care Management   Visit Note  09/13/2024  Name:  Katherine Richmond MRN: [id number] DOB: 1941-09-07  Situation: RNCM called for a scheduled telephone follow up. Patient reports this is not a good time to talk. She reports she is on her way to oncology CT scan and request to reschedule telephone visit.    Follow Up Plan:   Telephone follow up appointment date/time:  09/20/24 at 9:30 am   Heddy Shutter, RN, MSN, BSN, CCM [place]  [facility], Population Health Case Manager Phone: [phone]

## 2024-09-20 ENCOUNTER — Telehealth: Payer: Self-pay

## 2024-09-20 NOTE — Patient Instructions (Signed)
 Leeroy Almarie Domino - I am sorry I was unable to reach you today for our scheduled appointment. I work with Cox, Abigail, MD and am calling to support your healthcare needs. Please contact me at [phone] at your earliest convenience. I look forward to speaking with you soon.   Thank you,  Heddy Shutter, RN, MSN, BSN, CCM [place]  [facility], Population Health Case Manager Phone: [phone]

## 2024-09-21 ENCOUNTER — Ambulatory Visit: Admitting: Family Medicine

## 2024-09-21 DIAGNOSIS — C480 Malignant neoplasm of retroperitoneum: Secondary | ICD-10-CM | POA: Diagnosis not present

## 2024-09-21 DIAGNOSIS — C787 Secondary malignant neoplasm of liver and intrahepatic bile duct: Secondary | ICD-10-CM | POA: Diagnosis not present

## 2024-09-21 NOTE — Progress Notes (Signed)
 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 Dr. Anselm has explained to me that the drug(s) used may have good and bad effects. I have been given a copy of Understanding Cancer Treatment with printed information for the following drugs that includes drug-drug and drug-food interactions, a plan for missed doses and instructions on symptoms and adverse events that require me to contact the healthcare setting or seek immediate attention. I can contact a healthcare provider 24 hours a day by calling [phone].   Recommended Treatment Plan--It has been recommended you receive the following antineoplastic hazardous or biotherapy for treatment for your illness Antineoplastic HD/Biotherapy  Frequency # Expected Cycles  Gemcitabine  Every 21 days 6   Docetaxel  Every 21 days 6   1. The Physician has offered me or my legal representative the opportunity to ask questions and to have those questions answered before obtaining this Consent for Administration for antineoplastic HD and/or biotherapy. I understand the nature of the treatment. I have been instructed/informed of the following:  The risks, complications, and expected benefits or effects of the treatment or procedure including planned supportive care medications.  Any alternatives to the treatment plan or procedure, including no treatment, and risks/benefits of alternatives. The right to refuse treatment or procedure will in no way jeopardize my access to healthcare services.  Procedures for handling medications in the home and for handling body secretions and waste.  2. I understand that my doctors cannot be sure the treatment will help me.  3. I understand the antineoplastic HD and/or biotherapy medications  and supportive medications recommended by my doctor can have short and long-term effects. My doctor talked to me about the most important side effects that may include: Low Blood Counts  Heart Effects Hair Loss Reproductive/Fertility Effects  Risk of Infection/Bleeding Lung Effects Allergic Type Reactions Sexual Effects  Fatigue Kidney/Bladder Effects Hearing Loss  Secondary Cancers  Sores in Mouth or Throat Pancreas/Liver/Intestine Effects Thyroid /Adrenal Effects Skin Effects  Nausea/Vomiting Constipation/Diarrhea  Muscle/Bone/Nerve Effects    4. I understand that side effects of antineoplastic HD and/or biotherapy may be serious at times resulting in life-threatening side effects, hospitalization, or death. There may be side effects from my chemotherapy and/or biotherapy that are not listed on this form. Each patient can respond differently and could have side effects that have not been reported by others. 5. For women of childbearing potential: I understand abstinence or 2 forms of reliable contraception must be used during therapy and 1 month following discontinuation of treatment. Female patients should use a condom with women of child bearing potential during therapy and 1 month following discontinuation of treatment.  6. I understand that if I am experiencing symptoms or adverse events related to oral or self-administered treatment or if I need to cancel or reschedule an appointment that I should contact my healthcare provider for advice on continuing or discontinuing my treatment or to make a new appointment. 7. I understand that by signing this document I agree to undergo antineoplastic HD and/or biotherapy, which may include FDA-approved biosimilar or generic products. 8. I understand the Goal of Treatment at this time is to control the progression of my cancer or relieve symptoms and improve quality of life.   ____________________________________________________________________ Patient or  Financial Risk Analyst  Date/Time   ____________________________________________________________________ Witness Signature      Date/Time   ____________________________________________________________________ Verified By Signature     Date/Time   ____________________________________________________________________ Interpreter Signature     Date/Time  Refer to Media Tab in [place] to view a signed copy of this document.

## 2024-09-22 NOTE — Progress Notes (Deleted)
 [facility]  [address] [place],  [place]  [address] [phone]  Clinic Day:  09/22/2024  Referring physician: Sherre Clapper, MD  HISTORY OF PRESENT ILLNESS:  The patient is a 83 y.o. 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  As her hemoglobin has consistently held above 10 before last few visits, her anemia has been followed conservatively.  She comes in today for routine follow-up.  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  She is currently receiving gemcitabine /docetaxel , before last treatment being given 6 days ago.  The patient states frequent treatment delays have occurred due to her white count being too low to stay on track with her chemotherapy schedule.  As it pertains to her anemia, she denies having increased fatigue.  This patient did have a leiomyosarcoma resected from the edge of her liver in January 2021.  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   PHYSICAL EXAM:  There were no vitals taken for this visit. Wt Readings from Last 3 Encounters:  06/21/24 166 lb (75.3 kg)  12/15/23 161 lb (73 kg)  09/04/23 161 lb (73 kg)   There is no height or weight on file to calculate BMI. Performance status (ECOG): ]1 Physical Exam Constitutional:      Appearance: Normal appearance. She is not ill-appearing.  HENT:     Mouth/Throat:     Mouth: Mucous membranes are moist.     Pharynx: Oropharynx is clear. No oropharyngeal exudate or posterior oropharyngeal erythema.  Cardiovascular:     Rate and Rhythm: Normal rate and regular rhythm.     Heart sounds: No murmur heard.    No friction rub. No gallop.  Pulmonary:     Effort: Pulmonary effort is normal. No respiratory distress.     Breath sounds: Normal breath sounds.  No wheezing, rhonchi or rales.  Abdominal:     General: Bowel sounds are normal. There is no distension.     Palpations: Abdomen is soft. There is no mass.     Tenderness: There is no abdominal tenderness.  Musculoskeletal:        General: No swelling.     Right lower leg: No edema.     Left lower leg: No edema.  Lymphadenopathy:     Cervical: No cervical adenopathy.     Upper Body:     Right upper body: No supraclavicular or axillary adenopathy.     Left upper body: No supraclavicular or axillary adenopathy.     Lower Body: No right inguinal adenopathy. No left inguinal adenopathy.  Skin:    General: Skin is warm.     Coloration: Skin is not jaundiced.     Findings: No lesion or rash.  Neurological:     General: No focal deficit present.     Mental Status: She is alert and oriented to person, place, and time. Mental status is at baseline.  Psychiatric:        Mood and Affect: Mood normal.        Behavior: Behavior normal.        Thought Content: Thought content normal.   SCANS:  CT scans of her chest/abdomen/pelvis on 09/13/24 revealed the following: Impression CONCLUSION: 1. Larger, and possibly new, hepatic lesions. Findings may be secondary to noncontrast imaging versus prior CTs with  intravenous contrast.  2. Similar multiple pulmonary nodules.  3. Stable appearance right adrenalectomy and nephrectomy. 4. Similar complicated left renal cortical cyst. ------------------------------------------------------------------- FINDINGS:  . Thoracic inlet:  9 mm right thyroid  nodule. No suspicious lymph nodes.   .  MSK:   No aggressive osseous lesion.  Left gluteal intramuscular and intermuscular lipomas. No suspicious soft tissue mass.   SABRA  VASCULAR:     Moderate atherosclerotic calcifications.  Right anterior chest port catheter tip in right atrium.   THORAX:  . Mediastinum:     No acute lesions seen. Similar subcentimeter nodes.  SABRA  Heart/vessel:     No acute lesions  seen. Similar inferior cavoatrial junction calcification.  .  Lungs, Pleura:     Similar multiple pulmonary nodules/masses, for example:   27 x 32 mm right lower lobe (series 2 image 94), previously 26 x 31 mm.  17 x 19 mm left lower lobe (series 2 image 95), previously 18 x 18 mm.  No new suspicious nodule, consolidation or effusion seen.   ABDOMEN and PELVIS:  .  Liver, Spleen:     No splenic lesions.   Larger hepatic hypodensities, for example:  24 mm segment 7 (series 2 image 109), previously 20 mm,  24 mm segment 7 (series 2 image 115), previously 18 mm,  18 mm segment 4 (series 2 image 121), previously 12 mm.   Questionably new 9 mm segment 4 (series 2 image 128).   .  Pancreas, Gallbladder/biliary:   Cholelithiasis.  No biliary or pancreatic duct dilation seen.  No pancreatic mass or peripancreatic edema.   .  Adrenals:     Right adrenalectomy. Normal left adrenal gland.   .  Kidneys, Ureters:  Right nephrectomy.  No suspicious nephrectomy bed lesion.   10 mm left posterior interpolar exophytic hyperdense cyst.  No left hydroureteronephrosis or urolithiasis.   .  GI tract, Peritoneum, Mesentery and Extraperitoneum: Mild stool-filled rectal distention.  Otherwise, no acute lesions seen.  No pathologic bowel dilation or wall thickening.  Normal appendix. Colonic diverticulosis.  No suspicious mass, node, edema, ascites or pneumoperitoneum.   .  Bladder:       Within normal limits.   .  Reproductive:     Hysterectomy.   LABS:     Latest Reference Range & Units 04/14/23 11:03  Sodium 135 - 145 mmol/L 140  Potassium 3.5 - 5.1 mmol/L 4.1  Chloride 98 - 111 mmol/L 104  CO2 22 - 32 mmol/L 26  Glucose 70 - 99 mg/dL 885 (H)  BUN 8 - 23 mg/dL 16  Creatinine 9.55 - 8.99 mg/dL 8.62 (H)  Calcium  8.9 - 10.3 mg/dL 9.5  Anion gap 5 - 15  10  Alkaline Phosphatase 38 - 126 U/L 107  Albumin 3.5 - 5.0 g/dL 4.0  AST 15 - 41 U/L 45 (H)  ALT 0 - 44 U/L 55 (H)  Total Protein 6.5  - 8.1 g/dL 6.7  Total Bilirubin 0.3 - 1.2 mg/dL 0.7  GFR, Est Non African American >60 mL/min 39 (L)  (H): Data is abnormally high (L): Data is abnormally low  ASSESSMENT & PLAN:  Assessment/Plan:  A 83 y.o. female with a history of mild anemia secondary to iron deficiency and mild renal insufficiency.  When evaluating her labs today, her hemoglobin 9.6 is lower than what it has been previously.  However, as mentioned previously, she took docetaxel /gemcitabine  in less than a week ago for her metastatic leiomyosarcoma.  This is likely the  culprit behind her cytopenias.   From a hematologic standpoint, the patient is doing okay.  She knows work with her physicians at [facility] with respect to using growth factor support to improve her counts over time.  As she is clinically doing okay, I will see her back in 6 months for repeat clinical assessment.  The patient understands all the plans discussed today and is in agreement with them.    Keonte Daubenspeck DELENA Kerns, MD

## 2024-09-23 ENCOUNTER — Inpatient Hospital Stay: Attending: Oncology | Admitting: Oncology

## 2024-09-23 ENCOUNTER — Inpatient Hospital Stay

## 2024-09-27 ENCOUNTER — Encounter: Payer: Self-pay | Admitting: Family Medicine

## 2024-09-27 ENCOUNTER — Ambulatory Visit (HOSPITAL_COMMUNITY)
Admission: RE | Admit: 2024-09-27 | Discharge: 2024-09-27 | Disposition: A | Discharge status: Home / Self Care | Source: Ambulatory Visit | Attending: Family Medicine | Admitting: Family Medicine

## 2024-09-27 ENCOUNTER — Ambulatory Visit: Admitting: Family Medicine

## 2024-09-27 ENCOUNTER — Ambulatory Visit: Payer: Self-pay

## 2024-09-27 ENCOUNTER — Other Ambulatory Visit: Payer: Self-pay

## 2024-09-27 VITALS — BP 134/80 | HR 67 | Temp 97.8°F | Resp 16 | Ht 68.0 in | Wt 161.8 lb

## 2024-09-27 DIAGNOSIS — E1121 Type 2 diabetes mellitus with diabetic nephropathy: Secondary | ICD-10-CM

## 2024-09-27 DIAGNOSIS — E038 Other specified hypothyroidism: Secondary | ICD-10-CM | POA: Diagnosis not present

## 2024-09-27 DIAGNOSIS — G8929 Other chronic pain: Secondary | ICD-10-CM

## 2024-09-27 DIAGNOSIS — M21372 Foot drop, left foot: Secondary | ICD-10-CM | POA: Diagnosis not present

## 2024-09-27 DIAGNOSIS — N1831 Chronic kidney disease, stage 3a: Secondary | ICD-10-CM

## 2024-09-27 DIAGNOSIS — M79605 Pain in left leg: Secondary | ICD-10-CM

## 2024-09-27 DIAGNOSIS — M5412 Radiculopathy, cervical region: Secondary | ICD-10-CM

## 2024-09-27 DIAGNOSIS — I129 Hypertensive chronic kidney disease with stage 1 through stage 4 chronic kidney disease, or unspecified chronic kidney disease: Secondary | ICD-10-CM | POA: Diagnosis not present

## 2024-09-27 DIAGNOSIS — M5432 Sciatica, left side: Secondary | ICD-10-CM | POA: Diagnosis not present

## 2024-09-27 DIAGNOSIS — E782 Mixed hyperlipidemia: Secondary | ICD-10-CM

## 2024-09-27 DIAGNOSIS — C787 Secondary malignant neoplasm of liver and intrahepatic bile duct: Secondary | ICD-10-CM

## 2024-09-27 DIAGNOSIS — M25562 Pain in left knee: Secondary | ICD-10-CM

## 2024-09-27 DIAGNOSIS — E119 Type 2 diabetes mellitus without complications: Secondary | ICD-10-CM

## 2024-09-27 MED ORDER — HYDROCODONE-ACETAMINOPHEN 5-325 MG PO TABS: 1.0000 | ORAL_TABLET | Freq: Two times a day (BID) | ORAL | 0 refills | Status: DC | PRN

## 2024-09-27 MED ORDER — METHOCARBAMOL 500 MG PO TABS: 500.0000 mg | ORAL_TABLET | Freq: Two times a day (BID) | ORAL | 0 refills | Status: AC | PRN

## 2024-09-27 NOTE — Assessment & Plan Note (Signed)
  Orders:   Hemoglobin A1c   Microalbumin / creatinine urine ratio

## 2024-09-27 NOTE — Assessment & Plan Note (Signed)
  Orders:   T4, free   TSH

## 2024-09-27 NOTE — Progress Notes (Signed)
 Subjective:  Patient ID: Katherine Richmond, female    DOB: [birth year]  Age: 83 y.o. MRN: [id number]  Chief Complaint  Patient presents with   Medical Management of Chronic Issues    HPI: Discussed the use of AI scribe software for clinical note transcription with the patient, who gave verbal consent to proceed.  History of Present Illness Katherine Richmond is an 83 year old female who presents with persistent leg pain and new symptoms of a possible herniated disc in the neck.  Lower extremity pain and neurological symptoms - Persistent leg pain following microdiscectomy on September 01, 2024 - Initial post-surgical relief, but pain returned with increased intensity a week later but in a different location. - Current pain described as 'in my bone' and different from pre-surgery pain - Pain is localized and does not radiate down the leg - Lump and tenderness present behind the knee, causing knee to buckle - Left foot drop developed after an injury involving her husband and is not better.  - Pain level currently 8 out of 10, previously as high as 15 prior to surgery  Cervical radiculopathy symptoms - Possible herniated cervical disc with pain radiating down the arm - Weakness in the right hand - Arm pain more pronounced on one side but can radiate - No loss of bladder or bowel control - MRI not yet scheduled due to upcoming chemotherapy treatments  Functional status and activities of daily living - Not currently working - Manages household tasks as best as possible - Concerned about managing her health alongside her husband's medical needs, as he requires assistance and has multiple appointments  Current pain management and medications - Gabapentin  300 mg three times a day - Hydrocodone  taken at night for pain relief, recently completed a course  Hyperlipidemia - lipitor 40 mg before bed and Lovaza .   Hypertension - On toprol  xl and irbesartan /hydrochlorothiazide .    Allergic rhinitis - On Zyrtec  and Flonase  nasal spray  Hypothyroidism - on levothyroxine  50 mcg daily  Insomnia: On trazodone  100 mg at bedtime  Constitutional and other symptoms - No fevers, chills, sweats, earaches, sore throat, or stuffy nose       06/21/2024    7:58 AM 08/25/2023    9:28 AM 07/28/2023    8:44 AM 01/27/2023   10:19 AM 06/19/2021    4:02 PM  Depression screen PHQ 2/9  Decreased Interest 0 0 1 0 0  Down, Depressed, Hopeless 0 0 0 0 0  PHQ - 2 Score 0 0 1 0 0  Altered sleeping 0 0 2    Tired, decreased energy 1 0 1    Change in appetite 0 0 0    Feeling bad or failure about yourself  0 0 0    Trouble concentrating 0 0 0    Moving slowly or fidgety/restless 0 0 0    Suicidal thoughts 0 0 0    PHQ-9 Score 1  0  4     Difficult doing work/chores Not difficult at all Not difficult at all Not difficult at all       Data saved with a previous flowsheet row definition        08/30/2024   11:18 AM  Fall Risk   Falls in the past year? 1  Number falls in past yr: 1  Injury with Fall? 1  Comment scarred up knee  Risk for fall due to : History of fall(s)    Patient Care Team: Zury Fazzino,  Abigail, MD as PCP - General (Family Medicine) Debora Kiang, MD as Surgical Oncologist (Oncology) Ezzard Valaria LABOR, MD as Consulting Physician (Oncology) Delores Lauraine NOVAK, [facility] (Inactive) as Pharmacist (Pharmacist) Charissa Slough, DO (Obstetrics and Gynecology) Wallace, Juana M, RN as [facility] Care Management   Review of Systems  Constitutional:  Negative for chills, fatigue and fever.  HENT:  Negative for congestion, ear pain and sore throat.   Respiratory:  Negative for cough and shortness of breath.   Cardiovascular:  Negative for chest pain.  Gastrointestinal:  Negative for abdominal pain, constipation, diarrhea, nausea and vomiting.  Genitourinary:  Negative for dysuria and urgency.  Musculoskeletal:  Positive for back pain and neck pain. Negative for arthralgias and  myalgias.       Left leg pain  Skin:  Negative for rash.  Neurological:  Negative for dizziness and headaches.  Psychiatric/Behavioral:  Negative for dysphoric mood. The patient is not nervous/anxious.     Current Outpatient Medications on File Prior to Visit  Medication Sig Dispense Refill   aspirin 81 MG EC tablet Take 81 mg by mouth daily.     atorvastatin  (LIPITOR) 40 MG tablet Take 1 tablet (40 mg total) by mouth daily. 90 tablet 1   cetirizine  (ZYRTEC ) 10 MG tablet Take 1 tablet (10 mg total) by mouth at bedtime. 90 tablet 3   ferrous sulfate 325 (65 FE) MG tablet Take by mouth.     fluticasone  (FLONASE ) 50 MCG/ACT nasal spray Place 2 sprays into both nostrils daily. 48 g 1   gabapentin  (NEURONTIN ) 300 MG capsule Take 1 capsule (300 mg total) by mouth 3 (three) times daily. 90 capsule 2   irbesartan -hydrochlorothiazide  (AVALIDE) 300-12.5 MG tablet Take 1 tablet by mouth daily. 90 tablet 0   levothyroxine  (SYNTHROID ) 50 MCG tablet Take 1 tablet (50 mcg total) by mouth daily. 90 tablet 0   metFORMIN  (GLUCOPHAGE ) 1000 MG tablet Take 1 tablet by mouth daily. 90 tablet 1   metoprolol  succinate (TOPROL -XL) 100 MG 24 hr tablet Take 1 tablet (100 mg total) by mouth daily. 90 tablet 1   Multiple Vitamins-Minerals (MULTIPLE VITAMINS/WOMENS PO) Take by mouth.     omega-3 acid ethyl esters (LOVAZA ) 1 g capsule Take 2 capsules (2 g total) by mouth 2 (two) times daily. 360 capsule 1   traZODone  (DESYREL ) 100 MG tablet Take 1 tablet (100 mg total) by mouth at bedtime. 90 tablet 3   No current facility-administered medications on file prior to visit.   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   TONSILLECTOMY  2002    Family History  Problem Relation Age of Onset   Breast cancer Mother    Colon cancer Mother    Renal cancer Mother  Liver cancer Half-Brother    Multiple sclerosis Half-Brother    Hypertension Other    Transient ischemic attack Other    Glaucoma Other    Social History   Socioeconomic History   Marital status: Married    Spouse name: CLINICAL RESEARCH ASSOCIATE   Number of children: 1   Years of education: Not on file   Highest education level: Not on file  Occupational History   Occupation: RETIRED   Occupation: [employer] - [employer], [employer], FRI  Tobacco Use   Smoking status: Former    Current packs/day: 0.00    Types: Cigarettes    Quit date: 2012    Years since quitting: 13.9   Smokeless tobacco: Never  Substance and Sexual Activity   Alcohol use: Yes    Alcohol/week: 1.0 standard drink of alcohol    Types: 1 Glasses of wine per week    Comment: SOCIAL USE ONLY   Drug use: Never   Sexual activity: Not on file  Other Topics Concern   Not on file  Social History Narrative   Not on file   Social Drivers of Health   Financial Resource Strain: Low Risk  (08/25/2023)   Overall Financial Resource Strain (CARDIA)    Difficulty of Paying Living Expenses: Not hard at all  Food Insecurity: No Food Insecurity (08/30/2024)   Hunger Vital Sign    Worried About Running Out of Food in the Last Year: Never true    Ran Out of Food in the Last Year: Never true  Transportation Needs: No Transportation Needs (08/30/2024)   PRAPARE - Scientist, Research (physical Sciences) (Medical): No    Lack of Transportation (Non-Medical): No  Physical Activity: Insufficiently Active (08/25/2023)   Exercise Vital Sign    Days of Exercise per Week: 2 days    Minutes of Exercise per Session: 30 min  Stress: Stress Concern Present (08/25/2023)   [employer] of Occupational Health - Occupational Stress Questionnaire    Feeling of Stress : To some extent  Social Connections: Moderately Integrated (08/25/2023)   Social Connection and Isolation Panel    Frequency of Communication with Friends and Family: More than three times a week    Frequency of Social Gatherings with Friends and Family: More than three times a week    Attends Religious Services: 1 to 4 times per year    Active Member of [employer] or Organizations: No    Attends Engineer, Structural: Never    Marital Status: Married    Objective:  BP 134/80   Pulse 67   Temp 97.8 F (36.6 C) (Temporal)   Resp 16   Ht 5' 8 (1.727 m)   Wt 161 lb 12.8 oz (73.4 kg)   SpO2 98%   BMI 24.60 kg/m      09/27/2024    8:30 AM 09/27/2024    7:37 AM 06/21/2024    7:52 AM  BP/Weight  Systolic BP 134 158 138  Diastolic BP 80 70 80  Wt. (Lbs)  161.8 166  BMI  24.6 kg/m2 25.24 kg/m2    Physical Exam Vitals reviewed.  Constitutional:      Appearance: Normal appearance. She is normal weight.  Neck:     Vascular: No carotid bruit.  Cardiovascular:     Rate and Rhythm: Normal rate and regular rhythm.     Heart sounds: Normal heart sounds.  Pulmonary:     Effort: Pulmonary effort is normal. No respiratory distress.  Breath sounds: Normal breath sounds.  Abdominal:     General: Abdomen is flat. Bowel sounds are normal.     Palpations: Abdomen is soft.     Tenderness: There is no abdominal tenderness.  Musculoskeletal:        General: Tenderness (left upper leg hard area. tender over posterior knee) present.     Comments: RIGHT KNEE EXAM Tender: negative Patellar apprehension:  negative. Lateral ligament laxity: negative McMurray's signs: negative Anterior drawer movement/Lachmans: negative Posterior drawer movement: negative  LEFT KNEE EXAM Tender: medial  Patellar apprehension: positive. Lateral ligament laxity: negative McMurray's signs: positive  Neurological:     Mental Status: She is alert and oriented to person, place, and time.  Psychiatric:        Mood and Affect: Mood normal.        Behavior: Behavior normal.      Diabetic foot exam was performed with the following findings:   Normal sensation of 10g monofilament Intact posterior tibialis and dorsalis pedis pulses Drop foot on left.       Lab Results  Component Value Date   WBC 4.6 06/21/2024   HGB 11.4 06/21/2024   HCT 35.8 06/21/2024   PLT 195 06/21/2024   GLUCOSE 114 (H) 06/21/2024   CHOL 178 09/28/2024   TRIG 139 09/28/2024   HDL 61 09/28/2024   LDLCALC 89 09/28/2024   ALT 24 06/21/2024   AST 32 06/21/2024   NA 140 06/21/2024   K 4.4 06/21/2024   CL 101 06/21/2024   CREATININE 1.36 (H) 06/21/2024   BUN 15 06/21/2024   CO2 24 06/21/2024   TSH 1.180 09/28/2024   HGBA1C 6.1 (H) 09/28/2024    Results for orders placed or performed in visit on 06/21/24  Lipid panel   Collection Time: 06/21/24  8:28 AM  Result Value Ref Range   Cholesterol, Total 160 100 - 199 mg/dL   Triglycerides 865 0 - 149 mg/dL   HDL 60 >60 mg/dL   VLDL Cholesterol Cal 23 5 - 40 mg/dL   LDL Chol Calc (NIH) 77 0 - 99 mg/dL   Chol/HDL Ratio 2.7 0.0 - 4.4 ratio  Hemoglobin A1c   Collection Time: 06/21/24  8:28 AM  Result Value Ref Range   Hgb A1c MFr Bld 6.0 (H) 4.8 - 5.6 %   Est. average glucose Bld gHb Est-mCnc 126 mg/dL  CBC with Differential/Platelet   Collection Time: 06/21/24  8:28 AM  Result Value Ref Range   WBC 4.6 3.4 - 10.8 x10E3/uL   RBC 3.88 3.77 - 5.28 x10E6/uL   Hemoglobin 11.4 11.1 - 15.9 g/dL   Hematocrit 64.1 65.9 - 46.6 %   MCV 92 79 - 97 fL   MCH 29.4 26.6 - 33.0 pg    MCHC 31.8 31.5 - 35.7 g/dL   RDW 86.6 88.2 - 84.5 %   Platelets 195 150 - 450 x10E3/uL   Neutrophils 46 Not Estab. %   Lymphs 39 Not Estab. %   Monocytes 10 Not Estab. %   Eos 4 Not Estab. %   Basos 1 Not Estab. %   Neutrophils Absolute 2.1 1.4 - 7.0 x10E3/uL   Lymphocytes Absolute 1.8 0.7 - 3.1 x10E3/uL   Monocytes Absolute 0.4 0.1 - 0.9 x10E3/uL   EOS (ABSOLUTE) 0.2 0.0 - 0.4 x10E3/uL   Basophils Absolute 0.0 0.0 - 0.2 x10E3/uL   Immature Granulocytes 0 Not Estab. %   Immature Grans (Abs) 0.0 0.0 - 0.1 x10E3/uL  Comprehensive metabolic panel  Collection Time: 06/21/24  8:28 AM  Result Value Ref Range   Glucose 114 (H) 70 - 99 mg/dL   BUN 15 8 - 27 mg/dL   Creatinine, Ser 8.63 (H) 0.57 - 1.00 mg/dL   eGFR 39 (L) >40 fO/fpw/8.26   BUN/Creatinine Ratio 11 (L) 12 - 28   Sodium 140 134 - 144 mmol/L   Potassium 4.4 3.5 - 5.2 mmol/L   Chloride 101 96 - 106 mmol/L   CO2 24 20 - 29 mmol/L   Calcium  10.2 8.7 - 10.3 mg/dL   Total Protein 6.8 6.0 - 8.5 g/dL   Albumin 4.5 3.8 - 4.8 g/dL   Globulin, Total 2.3 1.5 - 4.5 g/dL   Bilirubin Total 0.2 0.0 - 1.2 mg/dL   Alkaline Phosphatase 123 (H) 44 - 121 IU/L   AST 32 0 - 40 IU/L   ALT 24 0 - 32 IU/L  .  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 CKD stable.     Mixed hyperlipidemia On Atorvastatin  and Lovaza . -Well controlled on current regimen. 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 -Continue Metformin . -Check A1C. Orders:   Hemoglobin A1c   Microalbumin / creatinine urine ratio   Secondary hypothyroidism Managed with levothyroxine  50 mcg daily. - Ordered thyroid  function tests. Orders:   T4, free   TSH  Acute leg pain, left Left lower extremity pain and swelling with lump Persistent pain and swelling  with palpable lump, differential includes clot or other pathology. - Ordered ultrasound of left leg to evaluate lump and rule out clot. - Ordered x-ray of left knee to assess for structural abnormalities.  Orders:   VAS US  LOWER EXTREMITY VENOUS (DVT); Future  Sciatica of left side Sent Hydrocodone -acetaminophen .  Orders:   HYDROcodone -acetaminophen  (NORCO/VICODIN) 5-325 MG tablet; Take 1 tablet by mouth 2 (two) times daily as needed for severe pain (pain score 7-10) (OSteoarthritis of lumbar spine/sciatica).   Chronic pain of left knee Left lower extremity pain and swelling with lump Persistent pain and swelling with palpable lump, differential includes clot or other pathology. - Ordered ultrasound of left leg to evaluate lump and rule out clot. - Ordered x-ray of left knee to assess for structural abnormalities.    Cervical radiculopathy Suspected cervical radiculopathy with possible herniated disc causing pain radiating down the arm and weakness in the right hand. MRI pending due to chemotherapy. - Schedule MRI of the neck after starting chemotherapy.    Acquired left foot drop Likely related to previous injury, fitted for a brace. - Continue use of left foot brace.    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 MRI pending due to chemotherapy schedule. - Proceed with chemotherapy as scheduled. - Will schedule MRI of the neck after starting chemotherapy.     Body mass index is 24.6 kg/m.    Meds ordered this encounter  Medications   methocarbamol  (ROBAXIN ) 500 MG tablet    Sig: Take 1 tablet (500 mg total) by mouth 2 (two) times daily as needed for muscle spasms (lumbar pain).    Dispense:  60 tablet    Refill:  0   HYDROcodone -acetaminophen  (NORCO/VICODIN) 5-325 MG tablet    Sig: Take 1 tablet by mouth 2 (two) times daily as needed for severe pain (pain score  7-10) (OSteoarthritis of lumbar  spine/sciatica).    Dispense:  60 tablet    Refill:  0    Orders Placed This Encounter  Procedures   Hemoglobin A1c   Lipid panel   T4, free   TSH   Microalbumin / creatinine urine ratio   VAS US  LOWER EXTREMITY VENOUS (DVT)     I,Marla I Leal-Borjas,acting as a scribe for Abigail Free, MD.,have documented all relevant documentation on the behalf of Abigail Free, MD,as directed by  Abigail Free, MD while in the presence of Abigail Free, MD.   Follow-up: Return in about 4 months (around 01/25/2025) for chronic follow up.  An After Visit Summary was printed and given to the patient.  Abigail Free, MD [facility] [phone]

## 2024-09-27 NOTE — Assessment & Plan Note (Signed)
  Orders:   HYDROcodone -acetaminophen  (NORCO/VICODIN) 5-325 MG tablet; Take 1 tablet by mouth 2 (two) times daily as needed for severe pain (pain score 7-10) (OSteoarthritis of lumbar spine/sciatica).

## 2024-09-27 NOTE — Assessment & Plan Note (Addendum)
 On Atorvastatin  and Lovaza . -Well controlled on current regimen. Orders:   Lipid panel

## 2024-09-27 NOTE — Assessment & Plan Note (Deleted)
 Managed with irbesartan  hydrochlorothiazide  and toprol  XL. Managed with metformin  1000 mg once daily.

## 2024-09-27 NOTE — Patient Instructions (Signed)
  VISIT SUMMARY: During your visit, we discussed your persistent leg pain, possible herniated disc in the neck, and other health concerns. We have planned several diagnostic tests and adjustments to your current treatment to address these issues.  YOUR PLAN: LEFT LOWER EXTREMITY PAIN AND SWELLING WITH LUMP: You have persistent pain and swelling in your left leg with a noticeable lump. -We have ordered an ultrasound of your left leg to evaluate the lump and rule out a clot. -We have also ordered an x-ray of your left knee to check for any structural abnormalities. Ordering for [employer].   LEFT FOOT DROP: You have a condition called foot drop, likely related to a previous injury. -Continue using the left foot brace as recommended.  CERVICAL RADICULOPATHY, POSSIBLE HERNIATED DISC: You have symptoms suggesting a possible herniated disc in your neck, causing pain and weakness in your arm. -We will schedule an MRI of your neck after you start your chemotherapy treatments.  CHEMOTHERAPY FOR MALIGNANCY: You are scheduled to start chemotherapy for your malignancy. -Proceed with your chemotherapy as planned. -We will schedule the MRI of your neck after you begin chemotherapy.  TYPE 2 DIABETES MELLITUS: You have type 2 diabetes, which is currently managed with medication. -Continue taking metformin  1000 mg once daily.  HYPERTENSION: You have high blood pressure, which is managed with medication. -Continue taking irbesartan  hydrochlorothiazide  and Toprol  XL as prescribed.  HYPERLIPIDEMIA: You have high cholesterol, which is managed with medication. -Continue taking Lipitor 40 mg daily.  HYPOTHYROIDISM: You have an underactive thyroid , which is managed with medication. -Continue taking levothyroxine  50 mcg daily. -We have ordered thyroid  function tests to monitor your condition.                      Contains text generated by [employer].                                  Contains text generated by [employer].

## 2024-09-28 ENCOUNTER — Ambulatory Visit (INDEPENDENT_AMBULATORY_CARE_PROVIDER_SITE_OTHER)
Admission: RE | Admit: 2024-09-28 | Discharge: 2024-09-28 | Disposition: A | Discharge status: Home / Self Care | Source: Ambulatory Visit | Attending: Family Medicine | Admitting: Family Medicine

## 2024-09-28 ENCOUNTER — Inpatient Hospital Stay

## 2024-09-28 ENCOUNTER — Ambulatory Visit: Payer: Self-pay | Admitting: Family Medicine

## 2024-09-28 ENCOUNTER — Other Ambulatory Visit: Payer: Self-pay | Admitting: Hematology and Oncology

## 2024-09-28 ENCOUNTER — Inpatient Hospital Stay: Admitting: Oncology

## 2024-09-28 DIAGNOSIS — N1831 Chronic kidney disease, stage 3a: Secondary | ICD-10-CM

## 2024-09-28 DIAGNOSIS — M79605 Pain in left leg: Secondary | ICD-10-CM | POA: Diagnosis not present

## 2024-09-28 DIAGNOSIS — M25562 Pain in left knee: Secondary | ICD-10-CM | POA: Diagnosis not present

## 2024-09-28 DIAGNOSIS — M5412 Radiculopathy, cervical region: Secondary | ICD-10-CM | POA: Insufficient documentation

## 2024-09-28 DIAGNOSIS — G8929 Other chronic pain: Secondary | ICD-10-CM | POA: Insufficient documentation

## 2024-09-28 DIAGNOSIS — Z79899 Other long term (current) drug therapy: Secondary | ICD-10-CM | POA: Diagnosis not present

## 2024-09-28 DIAGNOSIS — N189 Chronic kidney disease, unspecified: Secondary | ICD-10-CM | POA: Diagnosis not present

## 2024-09-28 LAB — T4, FREE: Free T4: 0.72 ng/dL (ref 0.61–1.12)

## 2024-09-28 LAB — LIPID PANEL
Cholesterol: 178 mg/dL (ref 0–200)
HDL: 61 mg/dL (ref >40)
LDL Cholesterol: 89 mg/dL (ref 0–99)
Total CHOL/HDL Ratio: 2.9 ratio
Triglycerides: 139 mg/dL (ref <150)
VLDL: 28 mg/dL (ref 0–40)

## 2024-09-28 LAB — TSH: TSH: 1.18 u[IU]/mL (ref 0.350–4.500)

## 2024-09-28 NOTE — Assessment & Plan Note (Addendum)
 Likely related to previous injury, fitted for a brace. - Continue use of left foot brace.

## 2024-09-28 NOTE — Assessment & Plan Note (Addendum)
 Suspected cervical radiculopathy with possible herniated disc causing pain radiating down the arm and weakness in the right hand. MRI pending due to chemotherapy. - Schedule MRI of the neck after starting chemotherapy.

## 2024-09-28 NOTE — Assessment & Plan Note (Addendum)
 Left lower extremity pain and swelling with lump Persistent pain and swelling with palpable lump, differential includes clot or other pathology. - Ordered ultrasound of left leg to evaluate lump and rule out clot. - Ordered x-ray of left knee to assess for structural abnormalities.

## 2024-09-28 NOTE — Assessment & Plan Note (Addendum)
 Active chemotherapy for malignancy with upcoming start date. MRI pending due to chemotherapy schedule. - Proceed with chemotherapy as scheduled. - Will schedule MRI of the neck after starting chemotherapy.

## 2024-09-28 NOTE — Assessment & Plan Note (Addendum)
 Left lower extremity pain and swelling with lump Persistent pain and swelling with palpable lump, differential includes clot or other pathology. - Ordered ultrasound of left leg to evaluate lump and rule out clot. - Ordered x-ray of left knee to assess for structural abnormalities. Orders:   VAS US  LOWER EXTREMITY VENOUS (DVT); Future

## 2024-09-29 LAB — MICROALBUMIN / CREATININE URINE RATIO
Creatinine, Urine: 219.5 mg/dL
Microalb Creat Ratio: 8 mg/g{creat} (ref 0–29)
Microalb, Ur: 16.8 ug/mL — ABNORMAL HIGH

## 2024-09-29 LAB — HEMOGLOBIN A1C
Hgb A1c MFr Bld: 6.1 % — ABNORMAL HIGH (ref 4.8–5.6)
Mean Plasma Glucose: 128 mg/dL

## 2024-10-01 DIAGNOSIS — R918 Other nonspecific abnormal finding of lung field: Secondary | ICD-10-CM | POA: Diagnosis not present

## 2024-10-01 DIAGNOSIS — C480 Malignant neoplasm of retroperitoneum: Secondary | ICD-10-CM | POA: Diagnosis not present

## 2024-10-01 DIAGNOSIS — Z5111 Encounter for antineoplastic chemotherapy: Secondary | ICD-10-CM | POA: Diagnosis not present

## 2024-10-01 DIAGNOSIS — C787 Secondary malignant neoplasm of liver and intrahepatic bile duct: Secondary | ICD-10-CM | POA: Diagnosis not present

## 2024-10-03 ENCOUNTER — Encounter: Payer: Self-pay | Admitting: Family Medicine

## 2024-10-03 NOTE — Assessment & Plan Note (Addendum)
 Managed with irbesartan  hydrochlorothiazide  and toprol  XL. CKD stable.

## 2024-10-06 ENCOUNTER — Other Ambulatory Visit: Payer: Self-pay | Admitting: Family Medicine

## 2024-10-06 ENCOUNTER — Ambulatory Visit: Payer: Self-pay | Admitting: Family Medicine

## 2024-10-07 ENCOUNTER — Inpatient Hospital Stay: Admitting: Oncology

## 2024-10-10 NOTE — Progress Notes (Unsigned)
 [facility] [address] [place],  [place]  [address] [phone]   Clinic Day:  10/11/2024  Referring physician: Sherre Clapper, MD  HISTORY OF PRESENT ILLNESS:  The patient is an 83 y.o. 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  She comes in today for routine follow-up.  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  Due to significant leukopenia, her palliative chemotherapy has been held over the past week.  She is scheduled to have her CBC checked at [facility] next week to see if her counts are better to where her chemotherapy can resume at that time.    PHYSICAL EXAM:  Blood pressure 134/67, pulse 66, temperature 98.1 F (36.7 C), temperature source Oral, resp. rate 18, height 5' 8 (1.727 m), weight 161 lb (73 kg), SpO2 98%. Wt Readings from Last 3 Encounters:  10/11/24 161 lb (73 kg)  09/27/24 161 lb 12.8 oz (73.4 kg)  06/21/24 166 lb (75.3 kg)   Body mass index is 24.48 kg/m. Performance status (ECOG): ]1 Physical Exam Constitutional:      Appearance: Normal appearance. She is not ill-appearing.  HENT:     Mouth/Throat:     Mouth: Mucous membranes are moist.     Pharynx: Oropharynx is clear. No oropharyngeal exudate or posterior oropharyngeal erythema.  Cardiovascular:     Rate and Rhythm: Normal rate and regular rhythm.     Heart sounds: No murmur heard.    No friction rub. No gallop.  Pulmonary:     Effort: Pulmonary effort is normal. No respiratory distress.     Breath sounds: Normal breath sounds. No wheezing, rhonchi or rales.  Abdominal:     General: Bowel sounds are normal. There is no distension.     Palpations: Abdomen is soft. There is no mass.     Tenderness: There is no abdominal tenderness.  Musculoskeletal:        General: No swelling.     Right  lower leg: No edema.     Left lower leg: No edema.  Lymphadenopathy:     Cervical: No cervical adenopathy.     Upper Body:     Right upper body: No supraclavicular or axillary adenopathy.     Left upper body: No supraclavicular or axillary adenopathy.     Lower Body: No right inguinal adenopathy. No left inguinal adenopathy.  Skin:    General: Skin is warm.     Coloration: Skin is not jaundiced.     Findings: No lesion or rash.  Neurological:     General: No focal deficit present.     Mental Status: She is alert and oriented to person, place, and time. Mental status is at baseline.  Psychiatric:        Mood and Affect: Mood normal.        Behavior: Behavior normal.        Thought Content: Thought content normal.   SCANS:  CT scans of her chest/abdomen/pelvis on 09/13/24 revealed the following: Impression CONCLUSION: 1. Larger, and possibly new, hepatic lesions. Findings may be secondary to noncontrast imaging versus prior CTs with intravenous contrast.  2. Similar multiple pulmonary nodules.  3. Stable appearance right adrenalectomy and nephrectomy. 4. Similar complicated left renal cortical cyst. ------------------------------------------------------------------- FINDINGS:  . Thoracic inlet:  9 mm right thyroid  nodule. No suspicious lymph nodes.   SABRA  MSK:   No aggressive osseous lesion.  Left gluteal intramuscular and intermuscular lipomas. No suspicious soft tissue mass.   SABRA  VASCULAR:     Moderate atherosclerotic calcifications.  Right anterior chest port catheter tip in right [facility].   THORAX:  . Mediastinum:     No acute lesions seen. Similar subcentimeter nodes.  SABRA  Heart/vessel:     No acute lesions seen. Similar inferior cavoatrial junction calcification.  .  Lungs, Pleura:     Similar multiple pulmonary nodules/masses, for example:   27 x 32 mm right lower lobe (series 2 image 94), previously 26 x 31 mm.  17 x 19 mm left lower lobe (series 2 image 95), previously  18 x 18 mm.  No new suspicious nodule, consolidation or effusion seen.   ABDOMEN and PELVIS:  .  Liver, Spleen:     No splenic lesions.   Larger hepatic hypodensities, for example:  24 mm segment 7 (series 2 image 109), previously 20 mm,  24 mm segment 7 (series 2 image 115), previously 18 mm,  18 mm segment 4 (series 2 image 121), previously 12 mm.   Questionably new 9 mm segment 4 (series 2 image 128).   .  Pancreas, Gallbladder/biliary:   Cholelithiasis.  No biliary or pancreatic duct dilation seen.  No pancreatic mass or peripancreatic edema.   .  Adrenals:     Right adrenalectomy. Normal left adrenal gland.   .  Kidneys, Ureters:  Right nephrectomy.  No suspicious nephrectomy bed lesion.   10 mm left posterior interpolar exophytic hyperdense cyst.  No left hydroureteronephrosis or urolithiasis.   .  GI tract, Peritoneum, Mesentery and Extraperitoneum: Mild stool-filled rectal distention.  Otherwise, no acute lesions seen.  No pathologic bowel dilation or wall thickening.  Normal appendix. Colonic diverticulosis.  No suspicious mass, node, edema, ascites or pneumoperitoneum.   .  Bladder:       Within normal limits.   .  Reproductive:     Hysterectomy.   LABS:  Her CBC on 10-08-24 at [facility]: Component Ref Range & Units 3 d ago  WBC 4.40 - 11.00 10*3/uL 1.90 Low   RBC 4.10 - 5.10 10*6/uL 3.29 Low   Hemoglobin 12.3 - 15.3 g/dL 9.5 Low   Hematocrit 64.0 - 44.6 % 28.3 Low   Mean Corpuscular Volume (MCV) 80.0 - 96.0 fL 85.9  Mean Corpuscular Hemoglobin (MCH) 27.5 - 33.2 pg 29.0  Mean Corpuscular Hemoglobin Conc (MCHC) 33.0 - 37.0 g/dL 66.1  Red Cell Distribution Width (RDW) 12.3 - 17.0 % 14.5  Platelet Count (PLT) 150 - 450 10*3/uL 130 Low   Mean Platelet Volume (MPV) 6.8 - 10.2 fL 7.0  Neutrophils % % 39  Lymphocytes % % 51  Monocytes % % 6  Eosinophils % % 4  Basophils % % 1  nRBC % % 0  Neutrophils Absolute 1.80 - 7.80 10*3/uL 0.70 Low     ASSESSMENT & PLAN:  Assessment/Plan:  An 83 y.o. female with a history of mild anemia secondary to iron deficiency and mild renal insufficiency.  As mentioned previously, she is on active chemotherapy for her metastatic leiomyosarcoma, which is the reason behind her recently low counts.  Labs were not done today as they are already scheduled next week at [facility] to determine if she can resume her palliative chemotherapy at that time.  On a side note, I did express to the patient that I would have no problem giving her chemotherapy at our facility  if the travel and family health issues become too problematic for her to continue to travel to [facility] on a regular basis.  Otherwise, I will see her back in 4 months for repeat clinical assessment.  The patient understands all the plans discussed today and is in agreement with them.    Kwadwo Taras DELENA Kerns, MD

## 2024-10-11 ENCOUNTER — Other Ambulatory Visit: Payer: Self-pay | Admitting: Oncology

## 2024-10-11 ENCOUNTER — Other Ambulatory Visit: Payer: Self-pay

## 2024-10-11 ENCOUNTER — Inpatient Hospital Stay: Attending: Oncology | Admitting: Oncology

## 2024-10-11 ENCOUNTER — Telehealth: Payer: Self-pay | Admitting: Oncology

## 2024-10-11 DIAGNOSIS — D72819 Decreased white blood cell count, unspecified: Secondary | ICD-10-CM | POA: Diagnosis not present

## 2024-10-11 DIAGNOSIS — D631 Anemia in chronic kidney disease: Secondary | ICD-10-CM

## 2024-10-11 DIAGNOSIS — D509 Iron deficiency anemia, unspecified: Secondary | ICD-10-CM | POA: Diagnosis present

## 2024-10-11 DIAGNOSIS — N289 Disorder of kidney and ureter, unspecified: Secondary | ICD-10-CM | POA: Diagnosis not present

## 2024-10-11 DIAGNOSIS — N189 Chronic kidney disease, unspecified: Secondary | ICD-10-CM | POA: Diagnosis not present

## 2024-10-11 DIAGNOSIS — C499 Malignant neoplasm of connective and soft tissue, unspecified: Secondary | ICD-10-CM | POA: Diagnosis not present

## 2024-10-11 NOTE — Telephone Encounter (Signed)
 Patient has been scheduled for follow-up visit per 10/11/2024 LOS.  Pt given an appt calendar with date and time.

## 2024-10-13 DIAGNOSIS — C480 Malignant neoplasm of retroperitoneum: Secondary | ICD-10-CM | POA: Diagnosis not present

## 2024-10-18 ENCOUNTER — Other Ambulatory Visit: Payer: Self-pay

## 2024-10-18 NOTE — Patient Outreach (Unsigned)
 Complex Care Management   Visit Note  10/18/2024  Name:  Katherine Richmond MRN: [id number] DOB: [birth year]  Situation: Referral received for Complex Care Management related to related to planned surgery, patient needs help with her husband, DM, Ca I obtained verbal consent from Patient.  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   Liver metastasis   Thyroid  disease     Assessment: Patient Reported Symptoms:  Cognitive Cognitive Status: No symptoms reported      Neurological Neurological Review of Symptoms: No symptoms reported    HEENT HEENT Symptoms Reported: No symptoms reported      Cardiovascular Cardiovascular Symptoms Reported: No symptoms reported    Respiratory Respiratory Symptoms Reported: No symptoms reported    Endocrine Endocrine Symptoms Reported: No symptoms reported Is patient diabetic?: Yes Is patient checking blood sugars at home?: No (per patient diet controlled.) Endocrine Self-Management Outcome: 4 (good)  Gastrointestinal Gastrointestinal Symptoms Reported: No symptoms reported      Genitourinary Genitourinary Symptoms Reported: No symptoms reported    Integumentary Integumentary Symptoms Reported: No symptoms reported    Musculoskeletal Musculoskelatal Symptoms Reviewed: Limited mobility, Unsteady gait Additional Musculoskeletal Details: reports disc surgery completed 09/01/24-reports healed. reports uses cane to keep her steady. Musculoskeletal Management Strategies: Medication therapy, Medical device, Routine screening Musculoskeletal Self-Management Outcome: 4 (good) Falls in the past year?:  (reports no falls since her back surgery in October 2025)    Psychosocial Psychosocial  Symptoms Reported: No symptoms reported         There were no vitals filed for this visit. Pain Scale: 0-10 Pain Score: 0-No pain  Medications Reviewed Today     Reviewed by Nadim Malia M, RN (Registered Nurse) on 10/18/24 at 1019  Med List Status: <None>   Medication Order Taking? Sig Documenting Provider Last Dose Status Informant  aspirin 81 MG EC tablet [id number] Yes Take 81 mg by mouth daily. [provider]  Active   atorvastatin  (LIPITOR) 40 MG tablet [id number] Yes Take 1 tablet (40 mg total) by mouth daily. Cox, Kirsten, MD  Active   cetirizine  (ZYRTEC ) 10 MG tablet [id number] Yes Take 1 tablet (10 mg total) by mouth at bedtime. Cox, Kirsten, MD  Active   ferrous sulfate 325 (65 FE) MG tablet [id number] Yes Take by mouth. [provider]  Active   fluticasone  (FLONASE ) 50 MCG/ACT nasal spray [id number] Yes Place 2 sprays into both nostrils daily. Cox, Kirsten, MD  Active   gabapentin  (NEURONTIN ) 300 MG capsule [id number] Yes Take 1 capsule (300 mg total) by mouth 3 (three) times daily. Cox, Kirsten, MD  Active   HYDROcodone -acetaminophen  (NORCO/VICODIN) 5-325 MG tablet [id number] Yes Take 1 tablet by mouth 2 (two) times daily as needed for severe pain (pain score 7-10) (OSteoarthritis of lumbar spine/sciatica). Cox, Kirsten, MD  Active   irbesartan -hydrochlorothiazide  (AVALIDE) 300-12.5 MG tablet [id number] Yes Take 1 tablet by mouth daily. Cox, Kirsten, MD  Active   levothyroxine  (SYNTHROID ) 50 MCG tablet [id number] Yes Take 1 tablet (50 mcg total) by mouth daily. Cox, Kirsten, MD  Active   metFORMIN  (GLUCOPHAGE ) 1000 MG tablet [id number] Yes Take 1 tablet by mouth daily. CoxAbigail, MD  Active   methocarbamol  (ROBAXIN )  500 MG tablet [id number] Yes Take 1 tablet (500 mg total) by mouth 2 (two) times daily as needed for muscle spasms (lumbar pain). Cox, Kirsten, MD  Active   metoprolol  succinate (TOPROL -XL) 100 MG 24 hr tablet [id number] Yes Take 1 tablet (100 mg total)  by mouth daily. Sherre Clapper, MD  Active   Multiple Vitamins-Minerals (MULTIPLE VITAMINS/WOMENS PO) [id number] Yes Take by mouth. [provider]  Active   omega-3 acid ethyl esters (LOVAZA ) 1 g capsule [id number] Yes Take 2 capsules (2 g total) by mouth 2 (two) times daily. Cox, Kirsten, MD  Active   traZODone  (DESYREL ) 100 MG tablet [id number] Yes Take 1 tablet (100 mg total) by mouth at bedtime. Cox, Kirsten, MD  Active           Recommendation:   Continue to follow up with Provider with health questions/concerns as needed  Follow Up Plan:   Patient has met all care management goals. Care Management case will be closed. Patient has been provided contact information should new needs arise.   Heddy Shutter, RN, MSN, BSN, CCM [place]  [facility], Population Health Case Manager Phone: [phone]

## 2024-10-18 NOTE — Patient Instructions (Signed)
 Visit Information  Thank you for taking time to visit with me today. Please don't hesitate to contact me if I can be of assistance to you.  Patient has met all care management goals. Care Management case will be closed. Patient has been provided contact information should new needs arise.   Please call the care guide team at [phone] if you need to cancel, schedule, or reschedule an appointment.   Please call the Suicide and Crisis Lifeline: 988 call the [place]  National Suicide Prevention Lifeline: 802-509-6071 or TTY: (539)641-3168 TTY (317) 704-3669) to talk to a trained counselor if you are experiencing a Mental Health or Behavioral Health Crisis or need someone to talk to.  Lindi Revering, RN, MSN, BSN, CCM [place]  [facility], Population Health Case Manager Phone: [phone]

## 2024-11-23 ENCOUNTER — Other Ambulatory Visit: Payer: Self-pay | Admitting: Family Medicine

## 2024-11-23 ENCOUNTER — Encounter: Payer: Self-pay | Admitting: Family Medicine

## 2024-11-23 DIAGNOSIS — M5432 Sciatica, left side: Secondary | ICD-10-CM

## 2024-11-23 DIAGNOSIS — S72009A Fracture of unspecified part of neck of unspecified femur, initial encounter for closed fracture: Secondary | ICD-10-CM | POA: Insufficient documentation

## 2024-11-23 MED ORDER — HYDROCODONE-ACETAMINOPHEN 5-325 MG PO TABS: 1.0000 | ORAL_TABLET | Freq: Four times a day (QID) | ORAL | 0 refills | Status: AC | PRN

## 2025-01-31 ENCOUNTER — Ambulatory Visit: Admitting: Family Medicine

## 2025-02-08 ENCOUNTER — Inpatient Hospital Stay

## 2025-02-08 ENCOUNTER — Inpatient Hospital Stay: Admitting: Oncology
